# Patient Record
Sex: Female | Born: 1960 | Race: White | Hispanic: No | Smoking: Never smoker
Health system: Southern US, Community
[De-identification: ages and names within clinical notes are randomized; demographics above are authoritative.]

## PROBLEM LIST (undated history)

## (undated) DIAGNOSIS — R112 Nausea with vomiting, unspecified: Secondary | ICD-10-CM

## (undated) DIAGNOSIS — T7840XA Allergy, unspecified, initial encounter: Secondary | ICD-10-CM

## (undated) DIAGNOSIS — Z9889 Other specified postprocedural states: Secondary | ICD-10-CM

## (undated) DIAGNOSIS — B009 Herpesviral infection, unspecified: Secondary | ICD-10-CM

## (undated) HISTORY — DX: Herpesviral infection, unspecified: B00.9

## (undated) HISTORY — PX: OTHER SURGICAL HISTORY: SHX169

## (undated) HISTORY — DX: Allergy, unspecified, initial encounter: T78.40XA

---

## 1990-07-30 HISTORY — PX: BREAST SURGERY: SHX581

## 1994-07-30 HISTORY — PX: UTERINE FIBROID SURGERY: SHX826

## 2005-11-05 ENCOUNTER — Emergency Department (HOSPITAL_COMMUNITY): Admission: EM | Admit: 2005-11-05 | Discharge: 2005-11-05 | Payer: Self-pay | Admitting: Family Medicine

## 2006-09-14 ENCOUNTER — Emergency Department (HOSPITAL_COMMUNITY): Admission: EM | Admit: 2006-09-14 | Discharge: 2006-09-14 | Payer: Self-pay | Admitting: Family Medicine

## 2008-09-16 ENCOUNTER — Ambulatory Visit (HOSPITAL_COMMUNITY): Admission: RE | Admit: 2008-09-16 | Discharge: 2008-09-16 | Payer: Self-pay | Admitting: Obstetrics and Gynecology

## 2009-09-27 ENCOUNTER — Emergency Department (HOSPITAL_COMMUNITY): Admission: EM | Admit: 2009-09-27 | Discharge: 2009-09-27 | Payer: Self-pay | Admitting: Family Medicine

## 2009-10-15 ENCOUNTER — Emergency Department (HOSPITAL_COMMUNITY): Admission: EM | Admit: 2009-10-15 | Discharge: 2009-10-15 | Payer: Self-pay | Admitting: Emergency Medicine

## 2010-01-27 IMAGING — US US PELVIS COMPLETE MODIFY
2 series · 13 of 25 positions shown · non-contrast
Comparison: None

CLINICAL DATA: Pelvic mass.  LMP 08/16/2008

TRANSABDOMINAL AND TRANSVAGINAL ULTRASOUND OF PELVIS
TECHNIQUE: Both transabdominal and transvaginal ultrasound
examinations of the pelvis were performed including evaluation of
the uterus, ovaries, adnexal regions, and pelvic cul-de-sac.

[Series 1: us transvaginal non-ob · 12 of 63 slices shown (1 of 2)]
[im 1/63]
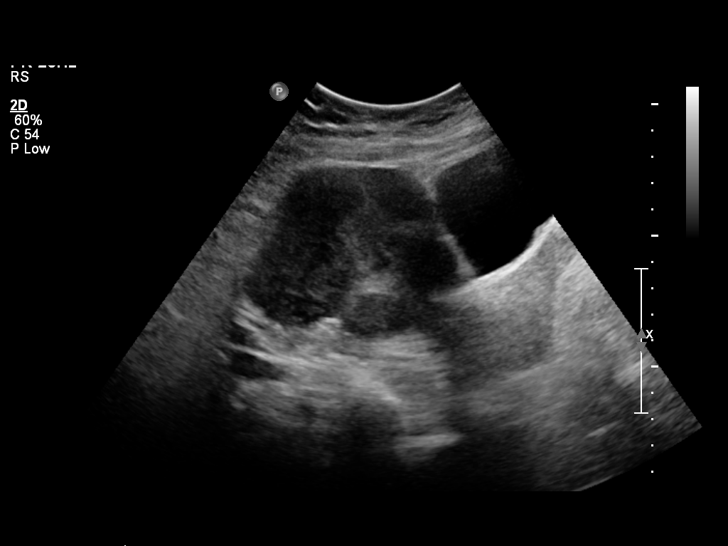
[im 6/63]
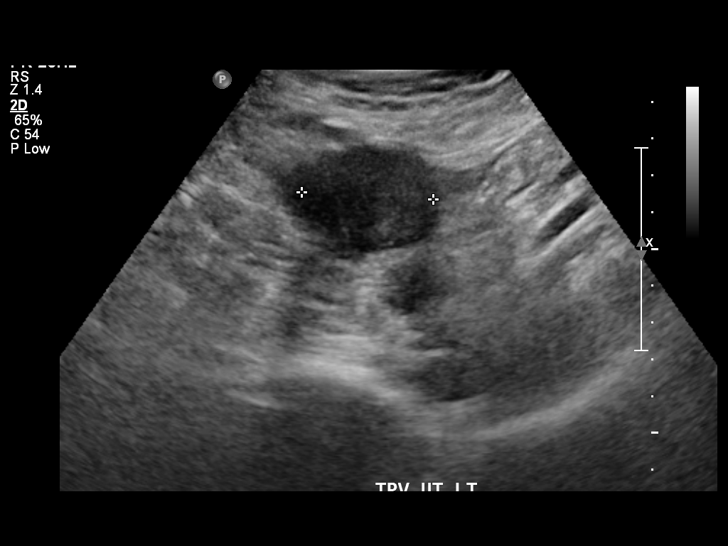
[im 11/63]
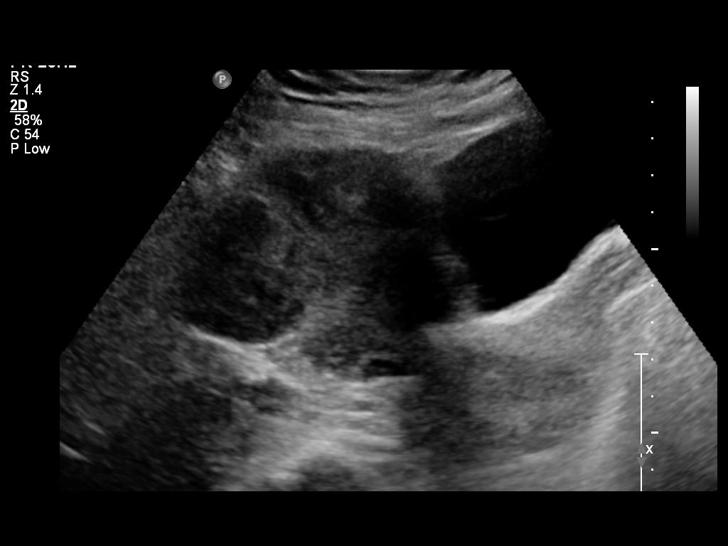
[im 17/63]
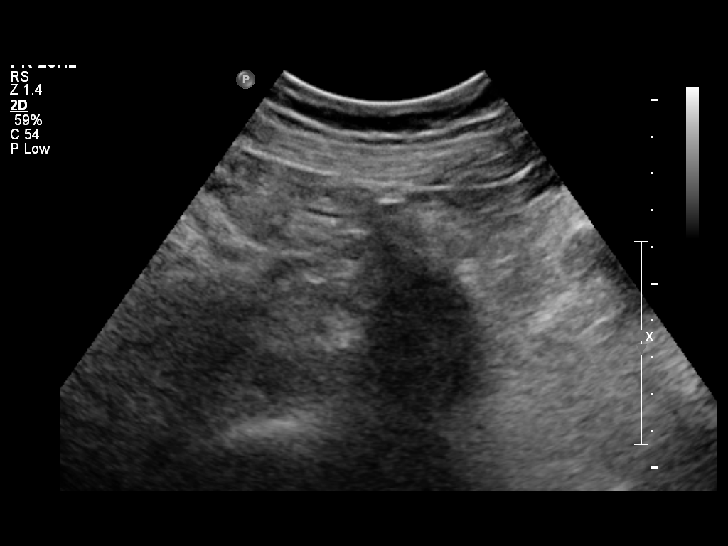
[im 22/63]
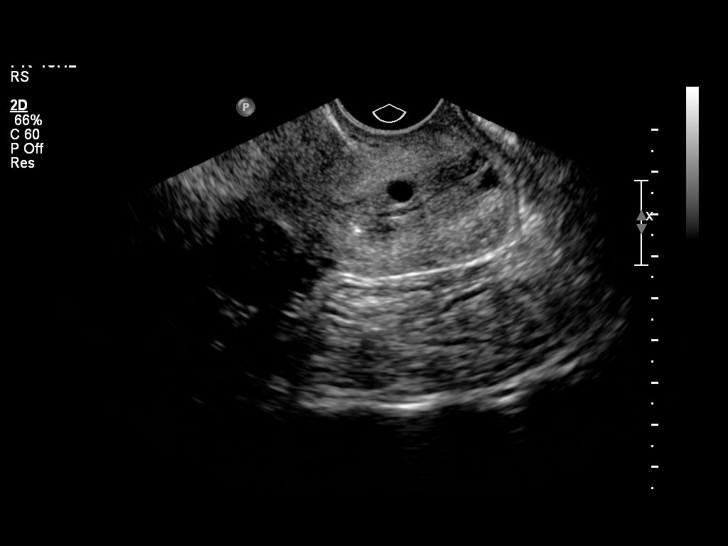
[im 27/63]
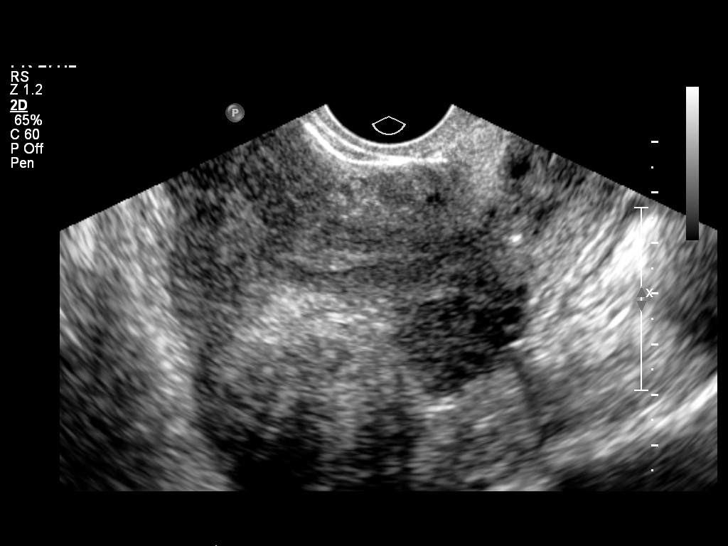
[im 33/63]
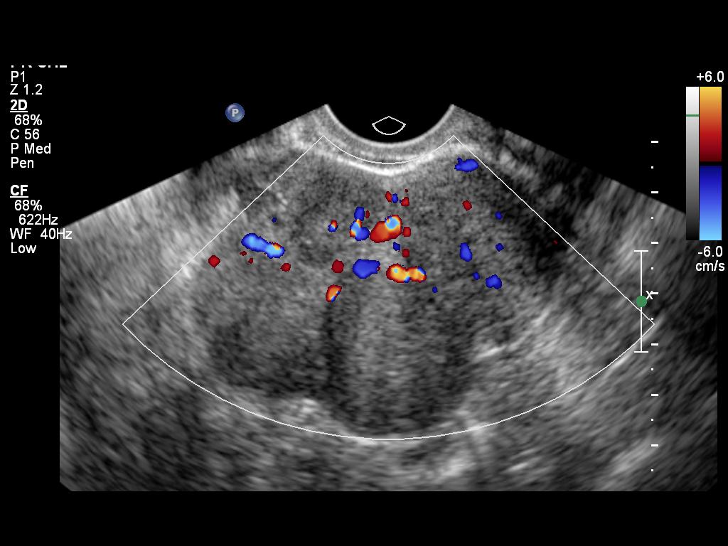
[im 38/63]
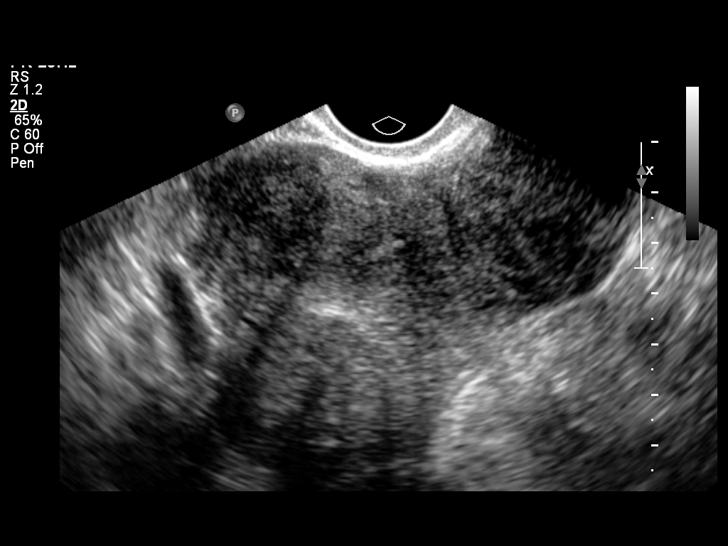
[im 44/63]
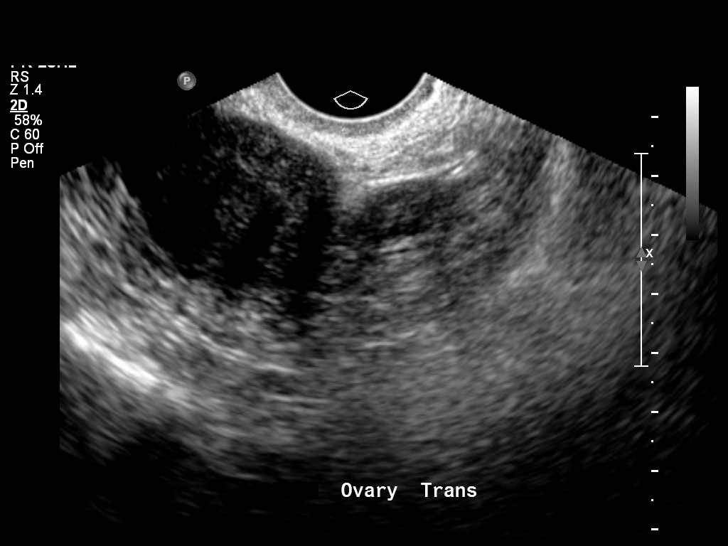
[im 49/63]
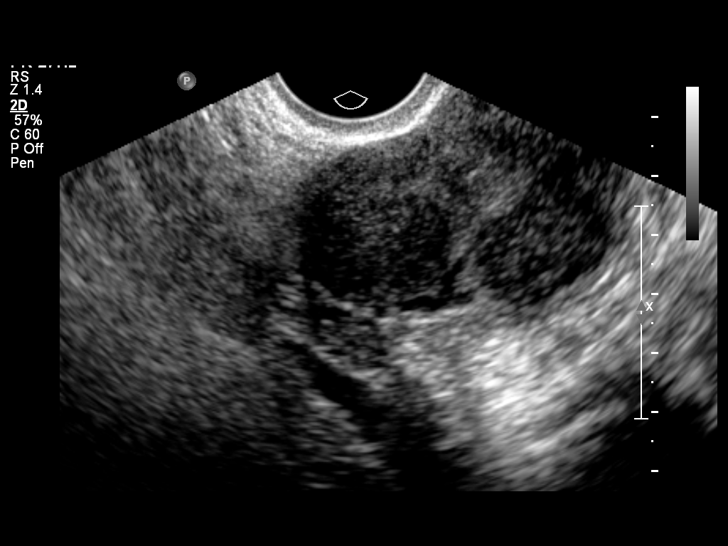
[im 54/63]
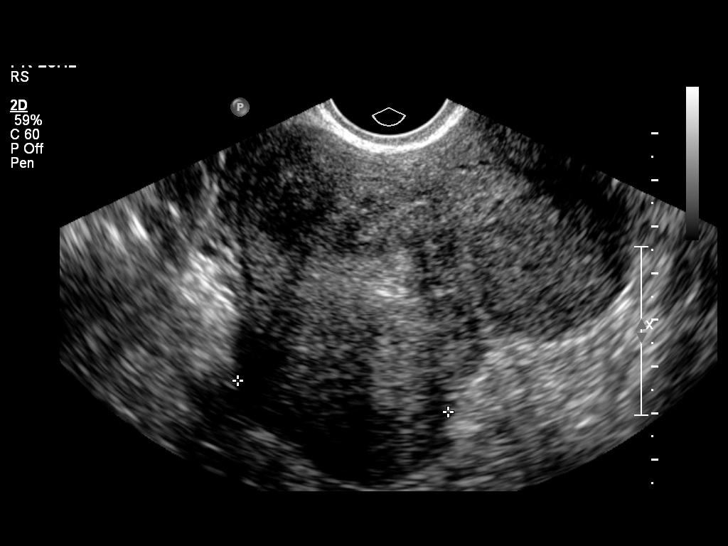
[im 60/63]
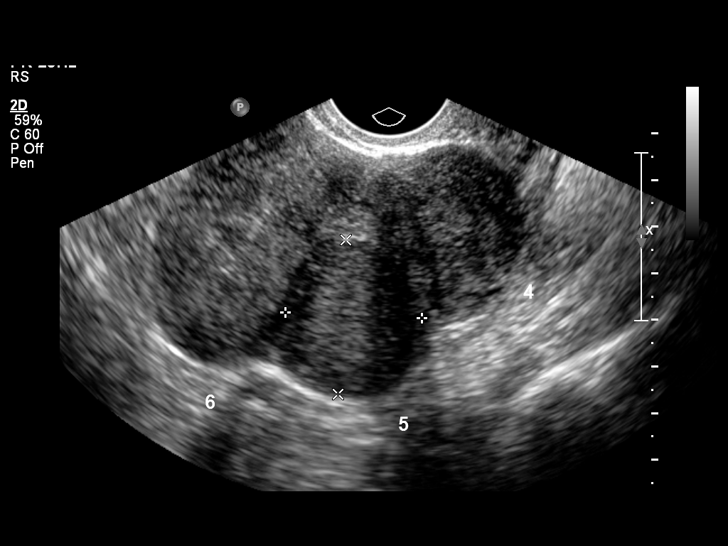

[Series 4: us transvaginal non-ob · 1 of 256 frames shown (2 of 2)]
[frame 129/256]
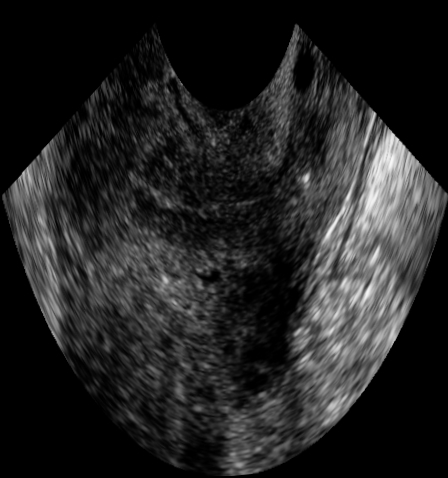

[13 of 25 positions shown; findings below may reference images not displayed]

FINDINGS: The uterus is enlarged demonstrating a sagittal length of
10.2 cm, an AP width of 5.4 cm and a transverse width of 6.6 cm.
There are multiple focal areas of altered echotexture compatible
with focal fibroids and these have sizes and locations as follows:
In the right posterolateral lower uterine segment, mural in
location measuring 2.8 x 2.7 x 2.9 cm, in the right posterolateral
fundal region measuring 4.3 x 3.8 x 4.6 cm and with a partial
subserosal component, in the posterior midbody with a partial
subserosal component measuring 2.2 x 2.1 x 2.2 cm, in the left
posterolateral lower uterine segment with a partial subserosal
component measuring 3.4 x 3.4 x 3.6 cm, in the left posterolateral
upper uterine segment measuring 2.9 x 3.3 x 3.4 cm and having a
partial subserosal component and in the posterior fundal region
measuring 3.0 x 3.3 x 3.2 cm and also having a partial subserosal
component.

The endometrial canal is thin and echogenic with an AP width of
mm.  No areas of focal thickening or inhomogeneity are identified
and no suggestion of a submucosal component to any of the
aforementioned fibroids is noted.

Both ovaries are seen and have a normal appearance with the right
ovary measuring 1.0 x 2.0 x 8.8 cm and the left ovary measuring
x 1.9 x 1.0 cm.

No cul-de-sac or periovarian fluid is seen.  No separate adnexal
masses are noted.
IMPRESSION: Fibroid uterus with fibroid sizes and locations as noted above.
Normal endometrial lining and ovaries.

## 2010-12-29 HISTORY — PX: COLON SURGERY: SHX602

## 2011-01-16 ENCOUNTER — Other Ambulatory Visit (INDEPENDENT_AMBULATORY_CARE_PROVIDER_SITE_OTHER): Payer: Self-pay | Admitting: Surgery

## 2011-01-16 ENCOUNTER — Encounter (HOSPITAL_COMMUNITY): Payer: PRIVATE HEALTH INSURANCE

## 2011-01-16 LAB — BASIC METABOLIC PANEL
BUN: 9 mg/dL (ref 6–23)
CO2: 25 mEq/L (ref 19–32)
Calcium: 9.3 mg/dL (ref 8.4–10.5)
Creatinine, Ser: 0.54 mg/dL (ref 0.50–1.10)
GFR calc non Af Amer: >60 mL/min (ref >60)
Potassium: 3.8 mEq/L (ref 3.5–5.1)

## 2011-01-16 LAB — CBC
MCH: 31 pg (ref 26.0–34.0)
RBC: 4.26 MIL/uL (ref 3.87–5.11)
WBC: 8.4 10*3/uL (ref 4.0–10.5)

## 2011-01-16 LAB — HCG, SERUM, QUALITATIVE: Preg, Serum: NEGATIVE

## 2011-01-16 LAB — SURGICAL PCR SCREEN
MRSA, PCR: NEGATIVE
Staphylococcus aureus: NEGATIVE

## 2011-01-18 ENCOUNTER — Encounter (INDEPENDENT_AMBULATORY_CARE_PROVIDER_SITE_OTHER): Payer: Self-pay | Admitting: Surgery

## 2011-01-18 DIAGNOSIS — N63 Unspecified lump in unspecified breast: Secondary | ICD-10-CM | POA: Insufficient documentation

## 2011-01-18 DIAGNOSIS — A64 Unspecified sexually transmitted disease: Secondary | ICD-10-CM

## 2011-01-18 DIAGNOSIS — R0989 Other specified symptoms and signs involving the circulatory and respiratory systems: Secondary | ICD-10-CM

## 2011-01-18 DIAGNOSIS — R490 Dysphonia: Secondary | ICD-10-CM

## 2011-01-18 DIAGNOSIS — J349 Unspecified disorder of nose and nasal sinuses: Secondary | ICD-10-CM

## 2011-01-18 DIAGNOSIS — K649 Unspecified hemorrhoids: Secondary | ICD-10-CM | POA: Insufficient documentation

## 2011-01-22 ENCOUNTER — Other Ambulatory Visit (INDEPENDENT_AMBULATORY_CARE_PROVIDER_SITE_OTHER): Payer: Self-pay | Admitting: Surgery

## 2011-01-22 ENCOUNTER — Inpatient Hospital Stay (HOSPITAL_COMMUNITY)
Admission: RE | Admit: 2011-01-22 | Discharge: 2011-01-25 | DRG: 331 | Disposition: A | Discharge status: Home / Self Care | Payer: PRIVATE HEALTH INSURANCE | Source: Ambulatory Visit | Attending: Surgery | Admitting: Surgery

## 2011-01-22 DIAGNOSIS — D371 Neoplasm of uncertain behavior of stomach: Secondary | ICD-10-CM

## 2011-01-22 DIAGNOSIS — K660 Peritoneal adhesions (postprocedural) (postinfection): Secondary | ICD-10-CM | POA: Diagnosis present

## 2011-01-22 DIAGNOSIS — D375 Neoplasm of uncertain behavior of rectum: Secondary | ICD-10-CM

## 2011-01-22 DIAGNOSIS — Z01812 Encounter for preprocedural laboratory examination: Secondary | ICD-10-CM

## 2011-01-22 DIAGNOSIS — D259 Leiomyoma of uterus, unspecified: Secondary | ICD-10-CM | POA: Diagnosis present

## 2011-01-22 DIAGNOSIS — D126 Benign neoplasm of colon, unspecified: Principal | ICD-10-CM | POA: Diagnosis present

## 2011-01-22 DIAGNOSIS — D378 Neoplasm of uncertain behavior of other specified digestive organs: Secondary | ICD-10-CM

## 2011-01-22 LAB — TYPE AND SCREEN
ABO/RH(D): B POS
Antibody Screen: NEGATIVE

## 2011-01-22 LAB — ABO/RH: ABO/RH(D): B POS

## 2011-01-23 LAB — CBC
HCT: 36.5 % (ref 36.0–46.0)
MCHC: 34 g/dL (ref 30.0–36.0)
MCV: 93.1 fL (ref 78.0–100.0)

## 2011-01-23 LAB — CREATININE, SERUM: GFR calc non Af Amer: >60 mL/min (ref >60)

## 2011-01-25 ENCOUNTER — Telehealth (INDEPENDENT_AMBULATORY_CARE_PROVIDER_SITE_OTHER): Payer: Self-pay | Admitting: Surgery

## 2011-01-25 NOTE — Telephone Encounter (Signed)
Message copied by Ethlyn Gallery on Thu Jan 25, 2011  4:57 PM ------      Message from: Ardeth Sportsman      Created: Thu Jan 25, 2011  2:55 PM       Tell pt the good news!

## 2011-01-25 NOTE — Telephone Encounter (Signed)
Called pt to notify her of the pathology report. Pt notified pathology to be a pre-cancerous polyp and lymph nodes benign per Dr Michaell Cowing. Pt will call back to schedule f/u appt with Dr Michaell Cowing for 2-3 weeks. [facility] 01-25-11

## 2011-01-25 NOTE — Op Note (Signed)
Autumn Calderon, MEDDERS NO.:  [id number]  MEDICAL RECORD NO.:  [id number]  LOCATION:  1534                         FACILITY:  [facility]  PHYSICIAN:  Ardeth Sportsman, MD     DATE OF BIRTH:  March 09, 1961  DATE OF PROCEDURE:  01/22/2011 DATE OF DISCHARGE:                              OPERATIVE REPORT   PRIMARY CARE PHYSICIAN:  Dr. Kyra Manges  GASTROENTEROLOGIST:  Jordan Hawks. Elnoria Howard, M.D.  SURGEON:  Ardeth Sportsman, M.D.  ASSISTANT:  Velora Heckler, MD  PREOPERATIVE DIAGNOSIS:  Colon polyps of hepatic flexure, unresectable endoscopically.  POSTOPERATIVE DIAGNOSES: 1. Colon polyps of hepatic flexure, unresectable endoscopically. 2. Fibroid uterus.  PROCEDURES PERFORMED: 1. Laparoscopic lysis of adhesions for 60 minutes. 2. Laparoscopically assisted partial colectomy with ileocolonic     anastomosis.  ANESTHESIA: 1. General anesthesia. 2. Local anesthetic in a field block around all port sites. 3. On-Q continuous bupivacaine pain pump.  SPECIMENS:  Right colon.  DRAINS:  None.  ESTIMATED BLOOD LOSS:  50 mL.  COMPLICATIONS:  None.  MAJOR INDICATIONS:  Ms. Kneece is a 50 year old mostly healthy female who had screening colonoscopy and was found to have a segment in the distal ascending colon near the hepatic flexure carpeted with numerous polyps. Dr. Elnoria Howard did not feel this was safe to be removed endoscopically and therefore recommended segmental partial colectomy.  The anatomy and physiology of the digestive tract was explained.  Pathophysiology of colonic polyposis was discussed.  Biopsies so far showed no high-grade dysplasia, but given the large region,  it was felt it would be wisest to do a segmental resection.  Technique, risks, benefits, alternatives were discussed.  Questions answered.  She agreed to proceed.  OPERATIVE FINDINGS:  She had about 8 cm segment carpeting polyps in the mid-to-distal ascending colon to the hepatic flexure.  She had  no evidence of any metastatic disease.  She had moderate adhesions of omentum to anterior abdominal wall and a fibroid uterus that was about 4 times normal as well as to colon.  DESCRIPTION OF PROCEDURE:  Informed consent was confirmed.  The patient underwent general anesthesia without any difficulty.  She was placed on the anti-ileus protocol.  She received subcutaneous heparin preoperatively and had sequential compression devices  active during the entire case.  She was positioned in low lithotomy with arms tucked.  Her abdomen was prepped and draped in a sterile fashion.  Surgical time-out confirmed the plan.  I attempted to place a 5 mm port through a cut-down incision through her prior infraumbilical incision and used optical entry.  I could get down to some of the  deeper preperitoneal plains, but did not feel I was able to get a clean entry.  Therefore, I converted from a single site to multiple-port colectomy.  I placed a #5 mm port in the left upper quadrant using optical entry technique with the patient in steep reverse Trendelenburg and left side up.  Entry was clean.  Camera inspection noted moderate adhesions of omentum to the infraumbilical region.  I was able to take the 5 mm port through the infraumbilical space and pass it into the peritoneal cavity. It was  not going into omentum and bowel and there was no evidence of any colonic or small bowel injury.  I placed another 5 mm port in the left midabdomen.  I did care to free the omentum off the anterior abdominal wall using a focussed cold scissors as well as some Enseal bipolar cautery system. With that, I could find the greater omentum had dense adhesions to a fibroid uterus as well as to the left colon.  Gradually, I was able to elevate it and free it off.  I did some gentle blunt dissection as well as focussed bipolar dissection and freed the great omentum off the fibroid uterus and then also off the left colon to  the splenic flexure. At that point, I could transition mainly over to cold scissors and only focussed bipolar as the attachments to the colon were more thin and wispy and not as bulky.  With that, I could mobilize the greater omentum out of the way.  There were only a few scant adhesions to the small bowel and I freed those off using scissors.  I could isolate the cecum and the appendix. __________ the cecum and noted in the distal ascending colon going up to the hepatic flexure where the tattooing on the posterior wall that Dr. Elnoria Howard had noted.  This could be seen with accurate localization.  I did not see any evidence of any peritoneal or visceral nodularity or metastasis and no evidence of any liver abnormalities.  Again, she had a fibroid uterus of moderate size, say about 4 times normal, but the ovaries looked normal and the fallopian tubes looked normal.  Her colon sweep otherwise looked normal.  Again, I mobilized the right colon in a medial to lateral fashion.  I elevated the cecum and found the ileocecal pedicle.  I was able to go between the ileomesentry and this pedicle and get in to the retromesenteric plane on the right side.  I freed the right colon mesentry off the duodenal sweep and pancreatic head coming up towards the falciform ligament.  I came more laterally and freed it off Gerota fascia as well.  I then mobilized in a lateral to medial fashion.  I freed the terminal ileomesentry off the pelvic brim and the psoas, taking care to isolate and skeletonize that and stay away from the retroperitoneal structures such as the ureter, gonadal vessels, etc.  I was able to come up and meet into the retromesenteric pocket of dissection and mobilize up around the hepatic flexure.  I freed that off.  Next, I found midtransverse colon and mobilized the mid-to-proximal transverse colon in a superior-to-inferior fashion.  I was able to free the attachments to the retro-mesentery  just superior to the duodenal bulb, going through thin, wispy layers and getting into that pocket. That way, I could completely mobilize the whole right colon towards the left abdomen off the midline.  This was consistent with excellent mobilization.  I made an extraction incision through the umbilicus.  I initially tried to preserve her prior belly button piercing superiorly, but had to go through that to be able to get enough wound , as she had rather bulky omentum and mesentry.  With that, I could eviscerate her whole right colon.  I could feel the polyps in between the 2 tattooed areas in the mid-to-distal ascending colon, of hepatic flexure, with excellent mobilization.  I chose a spot in the midtransverse colon with a good feeder off the right middle colic artery that was  over 5 cm distal to the tattooing of the hepatic flexure.  I transected that with a staple. I found a similar location in the terminal ileum as well.  I took the mesentery in a radial fashion.  I had not done previous high ligation as I wanted to make sure I had proper localization before ligating a major colonic branch.  I was able to come down in a radial-like fashion to the ileocolic pedicle and close to its takeoff off the middle colic artery. We skeletonized that and took most of the mesentery radially using bipolar Enseal, but once we came to the ileocolic pedicle, I ligated that using 2-0 silk ties, clamps x2, and Enseal distally.  With that, I took the specimen off, opened it up at the cecum away from the polyps.  I could easily feel the polyps carpeting about two-thirds of the circumference and I had at least 5 cm distal margin.  I had a pretty good high ligation as well and a decent swath of mesentery.  I changed gloves.  We ensured hemostasis.  I did a side-to-side stapled anastomosis using a 75-GIA stapler and closing the staple defect using interrupted silk stitches.  I trimmed some of the omentum  that was rather stretched out in a tongue and shortened that down to a more scalloped location to avoid any potential for further leak points and since it also looked rather thinned out there.  I laid a good healthy swath of omentum over the anastomosis, especially __________ used it as an omental patch using 2-0 and 3-0 interrupted silk stitches x3 to a good nice healthy patch.  I closed the common mesenteric defect using interrupted silk stitches down to the ties at the base.  We made sure we had proper orientation.  I returned the colon into the abdomen.  I did camera inspection. Hemostasis was excellent.  I irrigated the pelvis and a retrohepatic space around the small bowel and there was no torsion or twisting of the mesentry and defect was closed, omentum laid well, was viable.  I evacuated capnoperitoneum and removed the ports.  I placed the On-Q sheath in the preperitoneal plane by palpation through a prior Pfannenstiel incision going up towards both costal ridges and anterior axillary line.  I closed the periumbilical fascial defect using #1 PDS running stitch.  I closed the skin using interrupted Monocryl stitch in the ports.  A sterile dressing was applied.  The patient was extubated and taken to the recovery room in stable condition.  I had discussed postoperative care with the patient in detail in the office and prior to surgery, I am about to discuss it again with the family.     Ardeth Sportsman, MD     SCG/MEDQ  D:  01/22/2011  T:  01/23/2011  Job:  [id number]  cc:   Jordan Hawks. Elnoria Howard, MD Fax: [phone]  Dr. Kyra Manges  Electronically Signed by Karie Soda MD on 01/25/2011 07:15:07 AM

## 2011-02-05 NOTE — Discharge Summary (Signed)
NAMEHISAKO, Autumn Calderon NO.:  [id number]  MEDICAL RECORD NO.:  [id number]  LOCATION:  1534                         FACILITY:  [facility]  PHYSICIAN:  Ardeth Sportsman, MD     DATE OF BIRTH:  1961/05/31  DATE OF ADMISSION:  01/22/2011 DATE OF DISCHARGE:  01/25/2011                              DISCHARGE SUMMARY   PRIMARY CARE PHYSICIAN:  Katherine Roan, MD  GASTROENTEROLOGIST:  Jordan Hawks. Elnoria Howard, MD  SURGEON:  Ardeth Sportsman, MD  PRINCIPAL DIAGNOSIS:  Carpeted section of colonic polyps at ascending/hepatic flexure of colon.  OTHER DIAGNOSES: 1. Chronic seasonal allergies. 2. Uterine fibroid status post myomectomy. 3. Left breast fibroadenoma, status post excision.  PROCEDURE PERFORMED:  Laparoscopically-assisted right partial colectomy with ileocolonic anastomosis on January 22, 2011.  Pathology is pending.  MEDICATIONS:  As noted in the system, which includes ibuprofen, acyclovir, loratadine, and also she will have psyllium and oxycodone.  SUMMARY OF HOSPITAL COURSE:  Autumn Calderon is a 50 year old female who is found to have a carpeted section of polyps in the hepatic flexure near her ascending colon.  These were not felt to be safe to be endoscopically removed.  Dr. Elnoria Howard sent the patient to me for surgical evaluation.  She underwent partial colectomy.  Postoperatively, she was placed on the anti-ileus protocol.  She was advanced on her diet.  She was mobilized.  She had On-Q pain pump placed.  By the time of discharge, she is tolerating a solid diet, she has a good flatus, her pain is controlled with pain pills.  She is walking around the hallways.  She did have a little bit of a sore throat on postoperative day #2 with some sinusitis, but it is improved after getting her next dose of Claritin.  Based on these improvements, I feel it is safe to discharge home with the following instructions: 1. She is to return to clinic to see me in 10 to 14 days. 2. She  should continue to walk an hour a day, take fiber regularly,     use milk of magnesia p.r.n. for constipation.  She can use over-the-     counter antidiarrheals if she has issues with that as well.  She     should stick to mainly a bland diet at least first few days and     then gradually advance to a regular diet over the next week as     tolerated.  She should shower every day, keep the incisions clean     and dry.  She should call if she has worsening drainage or pain     from the incisions.  She should call if she has worsening fevers, chills, sweats, nausea, vomiting, bloating, uncontrolled diarrhea, worsening fatigue or fevers or other concerns.  I will notify her of the pathology results as soon as they come back, which may be later this week versus next week.     Ardeth Sportsman, MD     SCG/MEDQ  D:  01/25/2011  T:  01/25/2011  Job:  [id number]  cc:   S. Kyra Manges, M.D. Fax: [phone]  Jordan Hawks. Elnoria Howard, MD Fax: [phone]  Electronically Signed by Karie Soda MD on 02/05/2011 09:13:42 AM

## 2011-02-12 ENCOUNTER — Encounter (INDEPENDENT_AMBULATORY_CARE_PROVIDER_SITE_OTHER): Payer: Self-pay | Admitting: Surgery

## 2011-02-12 ENCOUNTER — Ambulatory Visit (INDEPENDENT_AMBULATORY_CARE_PROVIDER_SITE_OTHER): Payer: PRIVATE HEALTH INSURANCE | Admitting: Surgery

## 2011-02-12 DIAGNOSIS — D126 Benign neoplasm of colon, unspecified: Secondary | ICD-10-CM | POA: Insufficient documentation

## 2011-02-12 NOTE — Progress Notes (Signed)
Subjective:     Patient ID: Autumn Calderon, female   DOB: 1960-09-25, 50 y.o.   MRN: [id number]  HPI  The patient comes in today feeling well. She has 1-2 BMs a day. She's eating most everything she wants but has held back on salads, wonders if it is safe to start.  No fevers, chills, sweats. Off all pain meds. Feeling soreness with increased activity but overall improving. She wonders if it is okay to go back to work later this week.  Review of Systems  Constitutional: Negative for fever, chills, diaphoresis, appetite change and fatigue.  HENT: Negative for nosebleeds, sore throat, mouth sores, neck pain and neck stiffness.   Eyes: Negative for photophobia, discharge and visual disturbance.  Respiratory: Negative for cough, choking, chest tightness and shortness of breath.   Cardiovascular: Negative for chest pain and palpitations.  Gastrointestinal: Positive for abdominal pain. Negative for nausea, vomiting, diarrhea, constipation, blood in stool, abdominal distention, anal bleeding and rectal pain.       Mild soreness- improving  Genitourinary: Negative for dysuria, frequency, flank pain, vaginal bleeding, vaginal discharge and difficulty urinating.  Musculoskeletal: Negative for back pain, arthralgias and gait problem.  Skin: Negative for color change, pallor and rash.  Neurological: Negative for dizziness, speech difficulty, weakness and numbness.  Hematological: Negative for adenopathy. Does not bruise/bleed easily.  Psychiatric/Behavioral: Negative for confusion and agitation. The patient is not nervous/anxious.        Objective:   Physical Exam  Constitutional: She is oriented to person, place, and time. She appears well-developed and well-nourished. No distress.  HENT:  Head: Normocephalic.  Mouth/Throat: Oropharynx is clear and moist. No oropharyngeal exudate.  Eyes: Conjunctivae and EOM are normal. Pupils are equal, round, and reactive to light. No scleral icterus.  Neck:  Normal range of motion. Neck supple. No tracheal deviation present.  Cardiovascular: Normal rate, regular rhythm and intact distal pulses.   Pulmonary/Chest: Effort normal and breath sounds normal. No respiratory distress. She exhibits no tenderness.  Abdominal: Soft. She exhibits no distension and no mass. There is tenderness. There is no rebound and no guarding. Hernia confirmed negative in the right inguinal area and confirmed negative in the left inguinal area.       Mild soreness left perimedian at umbilical level.  Incisions well healed  Genitourinary: No vaginal discharge found.  Musculoskeletal: Normal range of motion. She exhibits no tenderness.  Lymphadenopathy:    She has no cervical adenopathy.       Right: No inguinal adenopathy present.       Left: No inguinal adenopathy present.  Neurological: She is alert and oriented to person, place, and time. No cranial nerve deficit. She exhibits normal muscle tone. Coordination normal.  Skin: Skin is warm and dry. No rash noted. She is not diaphoretic. No erythema.  Psychiatric: She has a normal mood and affect. Her behavior is normal. Judgment and thought content normal.   Pathology: Tubovillous adenoma with high-grade dysplasia. 0/18 lymph nodes positive.    Assessment:     3 weeks status post laparoscopic colectomy, recovering well. Pathology precancerous/benign.    Plan:     Increase activity as tolerated.  Diet as tolerated. Okay to phase in raw vegetables/salads. Go gradually.  Okay to return to work later this week. Half days Thursday/Friday. Then full time next week. I wrote a note.  Followup colonoscopy in one year with Dr. Elnoria Howard  Return to clinic p.r.n. She's recovered remarkably well. I left the door open  to call if new/further issues. She expressed understanding and appreciation

## 2011-04-17 ENCOUNTER — Telehealth (INDEPENDENT_AMBULATORY_CARE_PROVIDER_SITE_OTHER): Payer: Self-pay

## 2011-04-17 NOTE — Telephone Encounter (Signed)
Returned [employer]. Pt was calling b/c noticed having some lt side abdominal pain that comes and goes. Pt has been taking Metamucil but not really noticing any advantages to taking the fiber supliment so I recommended her switching to Mirilax. I told the pt to try this for a couple of weeks and see if she can tell a difference but if not to call me back and I would get her in to see Dr Michaell Cowing for an exam. The pt was fine with this and would call back if needed.Autumn Calderon

## 2013-04-28 ENCOUNTER — Encounter (INDEPENDENT_AMBULATORY_CARE_PROVIDER_SITE_OTHER): Payer: Self-pay

## 2013-08-04 ENCOUNTER — Other Ambulatory Visit: Payer: Self-pay | Admitting: Obstetrics & Gynecology

## 2013-08-06 ENCOUNTER — Encounter (HOSPITAL_COMMUNITY): Payer: Self-pay

## 2013-08-07 ENCOUNTER — Encounter (HOSPITAL_COMMUNITY): Payer: Self-pay

## 2013-08-21 ENCOUNTER — Ambulatory Visit (HOSPITAL_COMMUNITY)
Admission: RE | Admit: 2013-08-21 | Discharge: 2013-08-21 | Disposition: A | Discharge status: Home / Self Care | Payer: PRIVATE HEALTH INSURANCE | Source: Ambulatory Visit | Attending: Obstetrics & Gynecology | Admitting: Obstetrics & Gynecology

## 2013-08-21 ENCOUNTER — Encounter (HOSPITAL_COMMUNITY)
Admission: RE | Disposition: A | Discharge status: Home / Self Care | Payer: Self-pay | Source: Ambulatory Visit | Attending: Obstetrics & Gynecology

## 2013-08-21 ENCOUNTER — Encounter (HOSPITAL_COMMUNITY): Payer: PRIVATE HEALTH INSURANCE | Admitting: Anesthesiology

## 2013-08-21 ENCOUNTER — Ambulatory Visit (HOSPITAL_COMMUNITY): Payer: PRIVATE HEALTH INSURANCE | Admitting: Anesthesiology

## 2013-08-21 ENCOUNTER — Encounter (HOSPITAL_COMMUNITY): Payer: Self-pay | Admitting: *Deleted

## 2013-08-21 DIAGNOSIS — D250 Submucous leiomyoma of uterus: Secondary | ICD-10-CM | POA: Insufficient documentation

## 2013-08-21 DIAGNOSIS — N915 Oligomenorrhea, unspecified: Secondary | ICD-10-CM | POA: Insufficient documentation

## 2013-08-21 DIAGNOSIS — N840 Polyp of corpus uteri: Secondary | ICD-10-CM | POA: Insufficient documentation

## 2013-08-21 HISTORY — PX: DILATATION & CURRETTAGE/HYSTEROSCOPY WITH RESECTOCOPE: SHX5572

## 2013-08-21 HISTORY — DX: Other specified postprocedural states: Z98.890

## 2013-08-21 HISTORY — DX: Nausea with vomiting, unspecified: R11.2

## 2013-08-21 LAB — CBC
HCT: 39.2 % (ref 36.0–46.0)
Hemoglobin: 13.3 g/dL (ref 12.0–15.0)
MCH: 30.3 pg (ref 26.0–34.0)
MCHC: 33.9 g/dL (ref 30.0–36.0)
MCV: 89.3 fL (ref 78.0–100.0)
PLATELETS: 261 10*3/uL (ref 150–400)
RBC: 4.39 MIL/uL (ref 3.87–5.11)
RDW: 13.3 % (ref 11.5–15.5)
WBC: 7 10*3/uL (ref 4.0–10.5)

## 2013-08-21 SURGERY — DILATATION & CURETTAGE/HYSTEROSCOPY WITH RESECTOCOPE
Anesthesia: General | Site: Vagina

## 2013-08-21 MED ORDER — ONDANSETRON HCL 4 MG/2ML IJ SOLN
INTRAMUSCULAR | Status: DC | PRN
  Administered 2013-08-21: 4 mg via INTRAVENOUS

## 2013-08-21 MED ORDER — CHLOROPROCAINE HCL 1 % IJ SOLN
INTRAMUSCULAR | Status: AC
  Filled 2013-08-21: qty 30

## 2013-08-21 MED ORDER — PROPOFOL 10 MG/ML IV BOLUS
INTRAVENOUS | Status: DC | PRN
  Administered 2013-08-21: 180 mg via INTRAVENOUS

## 2013-08-21 MED ORDER — MIDAZOLAM HCL 2 MG/2ML IJ SOLN
INTRAMUSCULAR | Status: DC | PRN
  Administered 2013-08-21: 2 mg via INTRAVENOUS

## 2013-08-21 MED ORDER — METRONIDAZOLE IN NACL 5-0.79 MG/ML-% IV SOLN
500.0000 mg | Freq: Once | INTRAVENOUS | Status: AC
  Administered 2013-08-21: 500 mg via INTRAVENOUS
  Filled 2013-08-21: qty 100

## 2013-08-21 MED ORDER — LIDOCAINE HCL (CARDIAC) 20 MG/ML IV SOLN
INTRAVENOUS | Status: AC
  Filled 2013-08-21: qty 5

## 2013-08-21 MED ORDER — FENTANYL CITRATE 0.05 MG/ML IJ SOLN
INTRAMUSCULAR | Status: DC | PRN
  Administered 2013-08-21: 100 ug via INTRAVENOUS

## 2013-08-21 MED ORDER — PROPOFOL 10 MG/ML IV EMUL
INTRAVENOUS | Status: AC
  Filled 2013-08-21: qty 20

## 2013-08-21 MED ORDER — FENTANYL CITRATE 0.05 MG/ML IJ SOLN
INTRAMUSCULAR | Status: AC
  Filled 2013-08-21: qty 5

## 2013-08-21 MED ORDER — ACETAMINOPHEN 500 MG PO TABS: 1000.0000 mg | ORAL_TABLET | ORAL | Status: DC | PRN

## 2013-08-21 MED ORDER — ACETAMINOPHEN 160 MG/5ML PO SOLN: 325.0000 mg | ORAL | Status: DC | PRN

## 2013-08-21 MED ORDER — KETOROLAC TROMETHAMINE 30 MG/ML IJ SOLN
INTRAMUSCULAR | Status: DC | PRN
  Administered 2013-08-21: 30 mg via INTRAVENOUS

## 2013-08-21 MED ORDER — DEXAMETHASONE SODIUM PHOSPHATE 10 MG/ML IJ SOLN
INTRAMUSCULAR | Status: AC
  Filled 2013-08-21: qty 1

## 2013-08-21 MED ORDER — LACTATED RINGERS IV SOLN
INTRAVENOUS | Status: DC
  Administered 2013-08-21 (×3): via INTRAVENOUS

## 2013-08-21 MED ORDER — ONDANSETRON HCL 4 MG/2ML IJ SOLN
INTRAMUSCULAR | Status: AC
  Filled 2013-08-21: qty 2

## 2013-08-21 MED ORDER — DEXAMETHASONE SODIUM PHOSPHATE 10 MG/ML IJ SOLN
INTRAMUSCULAR | Status: DC | PRN
  Administered 2013-08-21: 10 mg via INTRAVENOUS

## 2013-08-21 MED ORDER — KETOROLAC TROMETHAMINE 30 MG/ML IJ SOLN
INTRAMUSCULAR | Status: AC
  Filled 2013-08-21: qty 1

## 2013-08-21 MED ORDER — FENTANYL CITRATE 0.05 MG/ML IJ SOLN: 25.0000 ug | INTRAMUSCULAR | Status: DC | PRN

## 2013-08-21 MED ORDER — CHLOROPROCAINE HCL 1 % IJ SOLN
INTRAMUSCULAR | Status: DC | PRN
  Administered 2013-08-21: 20 mL

## 2013-08-21 MED ORDER — MIDAZOLAM HCL 2 MG/2ML IJ SOLN
INTRAMUSCULAR | Status: AC
  Filled 2013-08-21: qty 2

## 2013-08-21 MED ORDER — LIDOCAINE HCL (CARDIAC) 20 MG/ML IV SOLN
INTRAVENOUS | Status: DC | PRN
  Administered 2013-08-21: 50 mg via INTRAVENOUS

## 2013-08-21 MED ORDER — GLYCINE 1.5 % IR SOLN
Status: DC | PRN
  Administered 2013-08-21: 3000 mL

## 2013-08-21 SURGICAL SUPPLY — 18 items
ABLATOR ENDOMETRIAL BIPOLAR (ABLATOR) IMPLANT
CANISTER SUCT 3000ML (MISCELLANEOUS) ×3 IMPLANT
CATH ROBINSON RED A/P 16FR (CATHETERS) ×3 IMPLANT
CLOTH BEACON ORANGE TIMEOUT ST (SAFETY) ×3 IMPLANT
CONTAINER PREFILL 10% NBF 60ML (FORM) ×6 IMPLANT
DRSG TELFA 3X8 NADH (GAUZE/BANDAGES/DRESSINGS) ×3 IMPLANT
ELECT REM PT RETURN 9FT ADLT (ELECTROSURGICAL) ×3
ELECTRODE REM PT RTRN 9FT ADLT (ELECTROSURGICAL) ×1 IMPLANT
GLOVE BIO SURGEON STRL SZ 6.5 (GLOVE) ×2 IMPLANT
GLOVE BIO SURGEONS STRL SZ 6.5 (GLOVE) ×1
GLOVE BIOGEL PI IND STRL 7.0 (GLOVE) ×1 IMPLANT
GLOVE BIOGEL PI INDICATOR 7.0 (GLOVE) ×2
GOWN STRL REIN XL XLG (GOWN DISPOSABLE) ×6 IMPLANT
LOOP ANGLED CUTTING 22FR (CUTTING LOOP) ×3 IMPLANT
PACK HYSTEROSCOPY LF (CUSTOM PROCEDURE TRAY) ×3 IMPLANT
PAD OB MATERNITY 4.3X12.25 (PERSONAL CARE ITEMS) ×3 IMPLANT
TOWEL OR 17X24 6PK STRL BLUE (TOWEL DISPOSABLE) ×6 IMPLANT
WATER STERILE IRR 1000ML POUR (IV SOLUTION) ×3 IMPLANT

## 2013-08-21 NOTE — Discharge Instructions (Addendum)
Hysteroscopy, Care After Refer to this sheet in the next few weeks. These instructions provide you with information on caring for yourself after your procedure. Your health care provider may also give you more specific instructions. Your treatment has been planned according to current medical practices, but problems sometimes occur. Call your health care provider if you have any problems or questions after your procedure.  WHAT TO EXPECT AFTER THE PROCEDURE After your procedure, it is typical to have the following:  You may have some cramping. This normally lasts for a couple days.  You may have bleeding. This can vary from light spotting for a few days to menstrual-like bleeding for 3 7 days. HOME CARE INSTRUCTIONS  Rest for the first 1 2 days after the procedure.  Only take over-the-counter or prescription medicines as directed by your health care provider. Do not take aspirin. It can increase the chances of bleeding.  Take showers instead of baths for 2 weeks or as directed by your health care provider.  Do not drive for 24 hours or as directed.  Do not drink alcohol while taking pain medicine.  Do not use tampons, douche, or have sexual intercourse for 2 weeks or until your health care provider says it is okay.  Take your temperature twice a day for 4 5 days. Write it down each time.  Follow your health care provider's advice about diet, exercise, and lifting.  If you develop constipation, you may:  Take a mild laxative if your health care provider approves.  Add bran foods to your diet.  Drink enough fluids to keep your urine clear or pale yellow.  Try to have someone with you or available to you for the first 24 48 hours, especially if you were given a general anesthetic.  Follow up with your health care provider as directed.  No Ibuprofen containing products (ie Advil. Aleve, Motrin, etc.) until after 4:00 pm today.   SEEK MEDICAL CARE IF:  You feel dizzy or  lightheaded.  You feel sick to your stomach (nauseous).  You have abnormal vaginal discharge.  You have a rash.  You have pain that is not controlled with medicine. SEEK IMMEDIATE MEDICAL CARE IF:  You have bleeding that is heavier than a normal menstrual period.  You have a fever.  You have increasing cramps or pain, not controlled with medicine.  You have new belly (abdominal) pain.  You pass out.  You have pain in the tops of your shoulders (shoulder strap areas).  You have shortness of breath. Document Released: 05/06/2013 Document Reviewed: 02/12/2013 [facility] Patient Information 2014 [place], [place].

## 2013-08-21 NOTE — Op Note (Signed)
08/21/2013  9:59 AM  PATIENT:  Autumn Calderon  53 y.o. female  PRE-OPERATIVE DIAGNOSIS:  Intra-Uterine Lesion, Oligomenorrhea    POST-OPERATIVE DIAGNOSIS:  Intra-Uterine Lesion, Oligomenorrhea    PROCEDURE:  Procedure(s): DILATATION & CURETTAGE/HYSTEROSCOPY WITH RESECTOCOPE  SURGEON:  Surgeon(s): Princess Bruins, MD  ASSISTANTS: none   ANESTHESIA:   general  PROCEDURE:  With laryngeal mask the patient is in lithotomy position. She is prepped with Betadine on the suprapubic, vulvar and vaginal areas. The bladder is catheterized. The patient is draped as usual. The vaginal exam reveals an anteverted uterus, no adnexal mass. The speculum is inserted in the vagina and the anterior lip of the cervix is grasped with a tenaculum. A paracervical block is done with Nesacaine 1% a total of 20 cc at 4 and 8:00. Dilation of the cervix with Hegar dilators up to #33 without difficulty. The hysteroscope was introduced and the intrauterine cavity and both ostia are well visualized, as well as an intrauterine lesion of about 3 cm in length attached to the fundus.  It probably corresponds to a polyp or possibly a submucosal myoma.  The resectoscope with the loop was used to remove the intra uterine lesion completely.  We then proceed with an intrauterine curettage on all intrauterine surfaces with a sharp curet. Both specimens are sent together to pathology.  Pictures were taken of the intrauterine cavity both before and after resection.  Hemostasis was adequate.  Cavity was normal with no other lesion.  All instruments were removed. The patient was brought to recovery room in good and stable status.  ESTIMATED BLOOD LOSS: 20 cc  FLUID DEFICIT:  130 cc   Intake/Output Summary (Last 24 hours) at 08/21/13 0959 Last data filed at 08/21/13 0954  Gross per 24 hour  Intake   1000 ml  Output    110 ml  Net    890 ml     BLOOD ADMINISTERED:none   LOCAL MEDICATIONS USED:  Paracervical block Nesacaine 1%  20 cc  SPECIMEN:  Source of Specimen:  Endometrial curettings and resection of IU lesion  DISPOSITION OF SPECIMEN:  PATHOLOGY  COUNTS:  YES  PLAN OF CARE: Transfer to PACU    Princess Bruins MD  08/19/13  10:02 am

## 2013-08-21 NOTE — H&P (Signed)
Autumn Calderon is an 53 y.o. female   RP:  Polypoid IU lesion for [facility] resection, D+C  Pertinent Gynecological History: Menses: flow is moderate Bleeding: intermenstrual bleeding Contraception: Abstinence/condoms Blood transfusions: none Sexually transmitted diseases: no past history Previous GYN Procedures: Myomectomies  Last mammogram: normal  Last pap: normal  OB History:   Menstrual History:  Patient's last menstrual period was 06/30/2013.    Past Medical History  Diagnosis Date  . PONV (postoperative nausea and vomiting)     low blood pressure after anesthesia    Past Surgical History  Procedure Laterality Date  . Uterine fibroid surgery  1996  . 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 Colon surgery  12/2010    partial colectomy  . Fibroid tumor removal      Family History  Problem Relation Age of Onset  . Other Mother     Myelofibrosis (bone marrow), protein s deficency  . Heart disease Father   . Cancer Father     Spot on kidney, part of kidney removed    Social History:  reports that she has never smoked. She does not have any smokeless tobacco history on file. She reports that she drinks alcohol. She reports that she does not use illicit drugs.  Allergies:  Allergies  Allergen Reactions  . Codeine Nausea And Vomiting  . Penicillins Rash    Childhood reaction    Prescriptions prior to admission  Medication Sig Dispense Refill  . loratadine (CLARITIN) 10 MG tablet Take 10 mg by mouth daily.         ROS  Blood pressure 109/52, pulse 77, temperature 98.2 F (36.8 C), temperature source Oral, resp. rate 18, height 5\' 5"  (1.651 m), weight 78.472 kg (173 lb), last menstrual period 06/30/2013, SpO2 98.00%. Physical Exam   Sonohysto:  IU polypoid lesion 2.9 cm   Results for orders placed during the hospital encounter of 08/21/13 (from the past 24 hour(s))  CBC     Status: None   Collection Time    08/21/13  8:18 AM     Result Value Range   WBC 7.0  4.0 - 10.5 K/uL   RBC 4.39  3.87 - 5.11 MIL/uL   Hemoglobin 13.3  12.0 - 15.0 g/dL   HCT 39.2  36.0 - 46.0 %   MCV 89.3  78.0 - 100.0 fL   MCH 30.3  26.0 - 34.0 pg   MCHC 33.9  30.0 - 36.0 g/dL   RDW 13.3  11.5 - 15.5 %   Platelets 261  150 - 400 K/uL    No results found.  Assessment/Plan: Polypoid IU lesion for [facility] resection, D+C.  Surgery and risks reviewed.  Mekia Dipinto,MARIE-LYNE 08/21/2013, 8:52 AM

## 2013-08-21 NOTE — Discharge Summary (Signed)
  Physician Discharge Summary  Patient ID: Autumn Calderon MRN: [id number] DOB/AGE: 11-25-1960 53 y.o.  Admit date: 08/21/2013 Discharge date: 08/21/2013  Admission Diagnoses: Intra-Uterine Lesion, [place]  Discharge Diagnoses: Intra-Uterine Lesion, Oligomenorrhea  [phone]        Active Problems:   * No active hospital problems. *   Discharged Condition: good  Hospital Course: Outpatient  Consults: None  Treatments: surgery: Hysteroscopy with resection and D+C  Disposition: 01-Home or Self Care     Medication List         loratadine 10 MG tablet  Commonly known as:  CLARITIN  Take 10 mg by mouth daily.           Follow-up Information   Follow up with Klay Sobotka,MARIE-LYNE, MD In 3 weeks.   Specialty:  Obstetrics and Gynecology   Contact information:   [place] [address] [phone]       Signed: Princess Bruins, MD 08/21/2013, 10:10 AM

## 2013-08-21 NOTE — Transfer of Care (Signed)
Immediate Anesthesia Transfer of Care Note  Patient: Autumn Calderon  Procedure(s) Performed: Procedure(s): DILATATION & CURETTAGE/HYSTEROSCOPY WITH RESECTOCOPE (N/A)  Patient Location: PACU  Anesthesia Type:General  Level of Consciousness: awake  Airway & Oxygen Therapy: Patient Spontanous Breathing  Post-op Assessment: Report given to PACU RN  Post vital signs: stable  Filed Vitals:   08/21/13 0806  BP: 109/52  Pulse:   Temp:   Resp:     Complications: No apparent anesthesia complications

## 2013-08-21 NOTE — Anesthesia Preprocedure Evaluation (Signed)

## 2013-08-24 ENCOUNTER — Encounter (HOSPITAL_COMMUNITY): Payer: Self-pay | Admitting: Obstetrics & Gynecology

## 2013-08-24 NOTE — Anesthesia Postprocedure Evaluation (Signed)
  Anesthesia Post-op Note  Patient: Autumn Calderon  Procedure(s) Performed: Procedure(s): DILATATION & CURETTAGE/HYSTEROSCOPY WITH RESECTOCOPE (N/A) Patient is awake and responsive. Pain and nausea are reasonably well controlled. Vital signs are stable and clinically acceptable. Oxygen saturation is clinically acceptable. There are no apparent anesthetic complications at this time. Patient is ready for discharge.

## 2013-08-26 NOTE — Addendum Note (Signed)
Addendum created 08/26/13 2263 by Lyndle Herrlich, MD   Modules edited: Anesthesia Attestations

## 2014-08-30 ENCOUNTER — Encounter: Payer: Self-pay | Admitting: Obstetrics & Gynecology

## 2016-05-28 ENCOUNTER — Encounter: Payer: Self-pay | Admitting: Obstetrics & Gynecology

## 2016-12-19 ENCOUNTER — Other Ambulatory Visit: Payer: Self-pay | Admitting: Obstetrics & Gynecology

## 2016-12-19 ENCOUNTER — Ambulatory Visit (INDEPENDENT_AMBULATORY_CARE_PROVIDER_SITE_OTHER): Payer: 59 | Admitting: Obstetrics & Gynecology

## 2016-12-19 ENCOUNTER — Encounter: Payer: Self-pay | Admitting: Obstetrics & Gynecology

## 2016-12-19 VITALS — BP 118/72 | Ht 65.0 in | Wt 153.0 lb

## 2016-12-19 DIAGNOSIS — J301 Allergic rhinitis due to pollen: Secondary | ICD-10-CM

## 2016-12-19 DIAGNOSIS — Z1151 Encounter for screening for human papillomavirus (HPV): Secondary | ICD-10-CM | POA: Diagnosis not present

## 2016-12-19 DIAGNOSIS — Z01419 Encounter for gynecological examination (general) (routine) without abnormal findings: Secondary | ICD-10-CM | POA: Diagnosis not present

## 2016-12-19 DIAGNOSIS — N951 Menopausal and female climacteric states: Secondary | ICD-10-CM | POA: Diagnosis not present

## 2016-12-19 DIAGNOSIS — A6000 Herpesviral infection of urogenital system, unspecified: Secondary | ICD-10-CM | POA: Diagnosis not present

## 2016-12-19 DIAGNOSIS — Z832 Family history of diseases of the blood and blood-forming organs and certain disorders involving the immune mechanism: Secondary | ICD-10-CM

## 2016-12-19 MED ORDER — LORATADINE 10 MG PO TABS: 10.0000 mg | ORAL_TABLET | Freq: Every day | ORAL | 4 refills | Status: DC

## 2016-12-19 MED ORDER — VALACYCLOVIR HCL 1 G PO TABS: 1000.0000 mg | ORAL_TABLET | Freq: Every day | ORAL | 3 refills | Status: DC

## 2016-12-19 NOTE — Patient Instructions (Signed)
1. Hot flashes due to menopause No menses x 1 year with Menopausal Sxs.  Would like to start on HRT low dose Combipatch if Menopause confirmed and Protein S normal.  Benefits and risks of HRT thoroughly reviewed including risk of Blood clots/stroke/Breast Ca.  Benefits with Symptoms, eventual sexual activity and Bone protection discussed. - [place]  2. Encounter for routine gynecological examination with Papanicolaou smear of cervix Normal Gyn exam.  Pap/HPV done.  Will schedule screening Mammo at [facility].  Will establish with Fam MD for Annual exam/Labs.  3. Family history of protein S deficiency Patient never had a blood clot, no DVT, no PE, no stroke.  -Protein S level  4. Herpes simplex infection of genitourinary system   Valacyclovir prescribed.  5. Seasonal allergic rhinitis due to pollen Claritin prescribed.  Autumn Calderon, it was a pleasure to see you today!  I will inform you of your results as soon as available.

## 2016-12-19 NOTE — Progress Notes (Signed)
Autumn Calderon [birth year] [id number]   History:    56 y.o. G0 divorced.  Abstinent currently.  RP:  Established patient presenting for annual gyn exam  D/ced Camila.  No menses x 1 year.  C/O severe hot flashes and night sweats interfering with her enjoyment of life.  Would like to start on HRT.  Mother with Protein S deficiency, rest of Thrombophilic w-up neg.  Sister with h/o Blood clots.  Patient never had a DVT/PE or Stroke.  No pelvic pain.  Normal vaginal secretions.  Breasts wnl.  Mictions/BMs normal.  Patient lost 13 Lbs since last yr with Weight watchers and regular physical activity.  BMI now 25.46  Past medical history,surgical history, family history and social history were all reviewed and documented in the EPIC chart.  Gynecologic History Patient's last menstrual period was 06/30/2013. Contraception: abstinence Last Pap:2/ 2016. Results were: normal Last mammogram: 04/2016. Results were: normal  Obstetric History OB History  Gravida Para Term Preterm AB Living  0 0 0 0 0 0  SAB TAB Ectopic Multiple Live Births  0 0 0 0 0         ROS: A ROS was performed and pertinent positives and negatives are included in the history.  GENERAL: No fevers or chills. HEENT: No change in vision, no earache, sore throat or sinus congestion. NECK: No pain or stiffness. CARDIOVASCULAR: No chest pain or pressure. No palpitations. PULMONARY: No shortness of breath, cough or wheeze. GASTROINTESTINAL: No abdominal pain, nausea, vomiting or diarrhea, melena or bright red blood per rectum. GENITOURINARY: No urinary frequency, urgency, hesitancy or dysuria. MUSCULOSKELETAL: No joint or muscle pain, no back pain, no recent trauma. DERMATOLOGIC: No rash, no itching, no lesions. ENDOCRINE: No polyuria, polydipsia, no heat or cold intolerance. No recent change in weight. HEMATOLOGICAL: No anemia or easy bruising or bleeding. NEUROLOGIC: No headache, seizures, numbness, tingling or weakness.  PSYCHIATRIC: No depression, no loss of interest in normal activity or change in sleep pattern.     Exam:   BP 118/72   Ht 5\' 5"  (1.651 m)   Wt 153 lb (69.4 kg)   LMP 06/30/2013   BMI 25.46 kg/m   Body mass index is 25.46 kg/m.  General appearance : Well developed well nourished female. No acute distress HEENT: Eyes: no retinal hemorrhage or exudates,  Neck supple, trachea midline, no carotid bruits, no thyroidmegaly Lungs: Clear to auscultation, no rhonchi or wheezes, or rib retractions  Heart: Regular rate and rhythm, no murmurs or gallops Breast:Examined in sitting and supine position were symmetrical in appearance, no palpable masses or tenderness,  no skin retraction, no nipple inversion, no nipple discharge, no skin discoloration, no axillary or supraclavicular lymphadenopathy Abdomen: no palpable masses or tenderness, no rebound or guarding Extremities: no edema or skin discoloration or tenderness  Pelvic:  Bartholin, Urethra, Skene Glands: Within normal limits             Vagina: No gross lesions or discharge  Cervix: No gross lesions or discharge.  Pap/HPV HR done  Uterus  AV, normal size, shape and consistency, non-tender and mobile  Adnexa  Without masses or tenderness  Anus and perineum  normal   Assessment/Plan:  56 y.o. female for annual exam  1. Hot flashes due to menopause No menses x 1 year with Menopausal Sxs.  Would like to start on HRT low dose Combipatch if Menopause confirmed and Protein S normal.  Benefits and risks of HRT thoroughly reviewed including risk of  Blood clots/stroke/Breast Ca.  Benefits with Symptoms, eventual sexual activity and Bone protection discussed. - [place]  2. Encounter for routine gynecological examination with Papanicolaou smear of cervix Normal Gyn exam.  Pap/HPV done.  Will schedule screening Mammo at [facility]  Will establish with Fam MD for Annual exam/Labs.  3. Family history of protein S deficiency Patient never had a blood clot,  no DVT, no PE, no stroke.  -Protein S level  4. Herpes simplex infection of genitourinary system   Valacyclovir prescribed.  5. Seasonal allergic rhinitis due to pollen Claritin prescribed.  Counseling on above issues >50% x 10 minutes.   Princess Bruins MD, 9:35 AM 12/19/2016

## 2016-12-19 NOTE — Addendum Note (Signed)
Addended by: Thurnell Garbe A on: 12/19/2016 12:47 PM   Modules accepted: Orders

## 2016-12-19 NOTE — Progress Notes (Signed)
Rx called to pharmacy

## 2016-12-20 LAB — FOLLICLE STIMULATING HORMONE: FSH: 50.3 m[IU]/mL

## 2016-12-21 LAB — PROTEIN S, TOTAL: PROTEIN S ANTIGEN, TOTAL: 125 % (ref 70–140)

## 2016-12-25 ENCOUNTER — Other Ambulatory Visit: Payer: Self-pay | Admitting: Obstetrics & Gynecology

## 2016-12-25 LAB — PAP, TP IMAGING W/ HPV RNA, RFLX HPV TYPE 16,18/45: HPV mRNA, High Risk: NOT DETECTED

## 2016-12-25 MED ORDER — ESTRADIOL-NORETHINDRONE ACET 0.05-0.14 MG/DAY TD PTTW: 1.0000 | MEDICATED_PATCH | TRANSDERMAL | 12 refills | Status: DC

## 2017-11-18 ENCOUNTER — Telehealth: Payer: Self-pay | Admitting: *Deleted

## 2017-11-18 MED ORDER — ESTRADIOL-NORETHINDRONE ACET 0.05-0.14 MG/DAY TD PTTW: 1.0000 | MEDICATED_PATCH | TRANSDERMAL | 2 refills | Status: DC

## 2017-11-18 NOTE — Telephone Encounter (Signed)
Patient has annual exam scheduled on 03/06/18, needs refill combipatch 0.05-0.14. Rx sent with refills until annual.

## 2017-11-20 ENCOUNTER — Encounter: Payer: Self-pay | Admitting: Obstetrics & Gynecology

## 2018-03-06 ENCOUNTER — Ambulatory Visit: Payer: 59 | Admitting: Obstetrics & Gynecology

## 2018-03-06 ENCOUNTER — Encounter: Payer: Self-pay | Admitting: Obstetrics & Gynecology

## 2018-03-06 VITALS — BP 126/84 | Ht 65.0 in | Wt 143.0 lb

## 2018-03-06 DIAGNOSIS — Z01419 Encounter for gynecological examination (general) (routine) without abnormal findings: Secondary | ICD-10-CM | POA: Diagnosis not present

## 2018-03-06 DIAGNOSIS — Z8619 Personal history of other infectious and parasitic diseases: Secondary | ICD-10-CM

## 2018-03-06 DIAGNOSIS — Z79890 Hormone replacement therapy (postmenopausal): Secondary | ICD-10-CM

## 2018-03-06 MED ORDER — ESTRADIOL-NORETHINDRONE ACET 0.05-0.14 MG/DAY TD PTTW: 1.0000 | MEDICATED_PATCH | TRANSDERMAL | 4 refills | Status: DC

## 2018-03-06 MED ORDER — VALACYCLOVIR HCL 1 G PO TABS: 1000.0000 mg | ORAL_TABLET | Freq: Every day | ORAL | 3 refills | Status: DC

## 2018-03-06 NOTE — Progress Notes (Signed)
Autumn Calderon February 06, 1961 [id number]   History:    57 y.o. G0 Divorced  RP:  Established patient presenting for annual gyn exam   HPI: Menopause, well on Combipatch.  Vasomotor Sx very well controled.  No PMB.  No pelvic pain.  Abstinent x last Gyn exam.  Pap 11/2016 negative/HPV HR neg.  Breasts wnl.  BMI 23.80, lost 10 Lbs x last year with Weight Watcher.  Health labs with family physician.  Colonoscopy in 2017.  Past medical history,surgical history, family history and social history were all reviewed and documented in the EPIC chart.  Gynecologic History Patient's last menstrual period was 06/30/2013. Contraception: Postmenopausal/Abstinent Last Pap: 11/2016. Results were: Negative/HPV HR neg Last mammogram: 10/2017. Results were: Negative Bone Density: Never Colonoscopy: 2017  Obstetric History OB History  Gravida Para Term Preterm AB Living  0 0 0 0 0 0  SAB TAB Ectopic Multiple Live Births  0 0 0 0 0     ROS: A ROS was performed and pertinent positives and negatives are included in the history.  GENERAL: No fevers or chills. HEENT: No change in vision, no earache, sore throat or sinus congestion. NECK: No pain or stiffness. CARDIOVASCULAR: No chest pain or pressure. No palpitations. PULMONARY: No shortness of breath, cough or wheeze. GASTROINTESTINAL: No abdominal pain, nausea, vomiting or diarrhea, melena or bright red blood per rectum. GENITOURINARY: No urinary frequency, urgency, hesitancy or dysuria. MUSCULOSKELETAL: No joint or muscle pain, no back pain, no recent trauma. DERMATOLOGIC: No rash, no itching, no lesions. ENDOCRINE: No polyuria, polydipsia, no heat or cold intolerance. No recent change in weight. HEMATOLOGICAL: No anemia or easy bruising or bleeding. NEUROLOGIC: No headache, seizures, numbness, tingling or weakness. PSYCHIATRIC: No depression, no loss of interest in normal activity or change in sleep pattern.     Exam:   BP 126/84   Ht 5\' 5"  (1.651 m)    Wt 143 lb (64.9 kg)   LMP 06/30/2013   BMI 23.80 kg/m   Body mass index is 23.8 kg/m.  General appearance : Well developed well nourished female. No acute distress HEENT: Eyes: no retinal hemorrhage or exudates,  Neck supple, trachea midline, no carotid bruits, no thyroidmegaly Lungs: Clear to auscultation, no rhonchi or wheezes, or rib retractions  Heart: Regular rate and rhythm, no murmurs or gallops Breast:Examined in sitting and supine position were symmetrical in appearance, no palpable masses or tenderness,  no skin retraction, no nipple inversion, no nipple discharge, no skin discoloration, no axillary or supraclavicular lymphadenopathy Abdomen: no palpable masses or tenderness, no rebound or guarding Extremities: no edema or skin discoloration or tenderness  Pelvic: Vulva: Normal             Vagina: No gross lesions or discharge  Cervix: No gross lesions or discharge  Uterus  AV, normal size, shape and consistency, non-tender and mobile  Adnexa  Without masses or tenderness  Anus: Normal   Assessment/Plan:  57 y.o. female for annual exam   1. Well female exam with routine gynecological exam Normal gynecologic exam and menopause.  Pap test negative with negative high-risk HPV in May 2018.  Breast exam normal.  Screening mammogram was negative in April 2019.  Colonoscopy in 2017.  Will do health labs with family physician.  2. Post-menopause on HRT (hormone replacement therapy) Well on Combipatch with good control of the vasomotor menopausal symptoms.  No postmenopausal bleeding.  No contraindication to continue with hormone replacement therapy.  Same regimen represcribed.  Recommend calcium intake of 1.2 g/day as a total of dietary and supplement, vitamin D supplements and regular weightbearing physical activity.  3. History of herpes genitalis Using valacyclovir for recurrences.  Prescription sent to pharmacy.  Other orders - estradiol-norethindrone (COMBIPATCH) 0.05-0.14  MG/DAY; Place 1 patch onto the skin 2 (two) times a week. - valACYclovir (VALTREX) 1000 MG tablet; Take 1 tablet (1,000 mg total) by mouth daily for 5 days.  Princess Bruins MD, 2:09 PM 03/06/2018

## 2018-03-07 ENCOUNTER — Encounter: Payer: Self-pay | Admitting: Obstetrics & Gynecology

## 2018-03-07 NOTE — Patient Instructions (Addendum)
1. Well female exam with routine gynecological exam Normal gynecologic exam and menopause.  Pap test negative with negative high-risk HPV in May 2018.  Breast exam normal.  Screening mammogram was negative in April 2019.  Colonoscopy in 2017.  Will do health labs with family physician.  2. Post-menopause on HRT (hormone replacement therapy) Well on Combipatch with good control of the vasomotor menopausal symptoms.  No postmenopausal bleeding.  No contraindication to continue with hormone replacement therapy.  Same regimen represcribed.  Recommend calcium intake of 1.2 g/day as a total of dietary and supplement, vitamin D supplements and regular weightbearing physical activity.  3. History of herpes genitalis Using valacyclovir for recurrences.  Prescription sent to pharmacy.  Other orders - estradiol-norethindrone (COMBIPATCH) 0.05-0.14 MG/DAY; Place 1 patch onto the skin 2 (two) times a week. - valACYclovir (VALTREX) 1000 MG tablet; Take 1 tablet (1,000 mg total) by mouth daily for 5 days.  Angie, it was a pleasure seeing you today!

## 2018-07-30 HISTORY — PX: BREAST BIOPSY: SHX20

## 2018-11-14 ENCOUNTER — Other Ambulatory Visit: Payer: Self-pay | Admitting: Obstetrics & Gynecology

## 2018-12-31 ENCOUNTER — Encounter: Payer: Self-pay | Admitting: Obstetrics & Gynecology

## 2018-12-31 ENCOUNTER — Other Ambulatory Visit: Payer: Self-pay | Admitting: Radiology

## 2019-03-09 ENCOUNTER — Other Ambulatory Visit: Payer: Self-pay | Admitting: Obstetrics & Gynecology

## 2019-04-02 ENCOUNTER — Other Ambulatory Visit: Payer: Self-pay | Admitting: Obstetrics & Gynecology

## 2019-04-30 ENCOUNTER — Other Ambulatory Visit: Payer: Self-pay | Admitting: Obstetrics & Gynecology

## 2019-05-27 ENCOUNTER — Other Ambulatory Visit: Payer: Self-pay | Admitting: Obstetrics & Gynecology

## 2019-06-01 ENCOUNTER — Ambulatory Visit (INDEPENDENT_AMBULATORY_CARE_PROVIDER_SITE_OTHER): Payer: 59 | Admitting: Obstetrics & Gynecology

## 2019-06-01 ENCOUNTER — Encounter: Payer: Self-pay | Admitting: Obstetrics & Gynecology

## 2019-06-01 ENCOUNTER — Other Ambulatory Visit: Payer: Self-pay

## 2019-06-01 VITALS — BP 118/76 | Ht 65.0 in | Wt 143.0 lb

## 2019-06-01 DIAGNOSIS — Z79890 Hormone replacement therapy (postmenopausal): Secondary | ICD-10-CM

## 2019-06-01 DIAGNOSIS — Z01419 Encounter for gynecological examination (general) (routine) without abnormal findings: Secondary | ICD-10-CM | POA: Diagnosis not present

## 2019-06-01 DIAGNOSIS — D219 Benign neoplasm of connective and other soft tissue, unspecified: Secondary | ICD-10-CM

## 2019-06-01 DIAGNOSIS — Z8619 Personal history of other infectious and parasitic diseases: Secondary | ICD-10-CM | POA: Diagnosis not present

## 2019-06-01 MED ORDER — VALACYCLOVIR HCL 1 G PO TABS: 1000.0000 mg | ORAL_TABLET | Freq: Every day | ORAL | 3 refills | Status: DC

## 2019-06-01 MED ORDER — COMBIPATCH 0.05-0.14 MG/DAY TD PTTW: MEDICATED_PATCH | TRANSDERMAL | 4 refills | Status: DC

## 2019-06-01 NOTE — Progress Notes (Signed)
Autumn Calderon 06/17/1961 [id number]   History:    58 y.o. G0 Divorced  RP:  Established patient presenting for annual gyn exam   HPI: Postmenopausal, well on CombiPatch for the last 2 years.  No postmenopausal bleeding.  No pelvic pain.  Abstinent for more than 2 years.  Urine and bowel movements normal.  Breasts normal.  Very good body mass index at 23.8.  Exercising regularly and on a healthy nutrition.  We will follow-up here for fasting health labs.  Past medical history,surgical history, family history and social history were all reviewed and documented in the EPIC chart.  Gynecologic History Patient's last menstrual period was 06/30/2013. Contraception: post menopausal status Last Pap: 11/2016. Results were: Negative/HPV HR neg Last mammogram: 11/2018. Results were: Negative.  Will obtain report from [facility] Bone Density: Never Colonoscopy: 2017  Obstetric History OB History  Gravida Para Term Preterm AB Living  0 0 0 0 0 0  SAB TAB Ectopic Multiple Live Births  0 0 0 0 0     ROS: A ROS was performed and pertinent positives and negatives are included in the history.  GENERAL: No fevers or chills. HEENT: No change in vision, no earache, sore throat or sinus congestion. NECK: No pain or stiffness. CARDIOVASCULAR: No chest pain or pressure. No palpitations. PULMONARY: No shortness of breath, cough or wheeze. GASTROINTESTINAL: No abdominal pain, nausea, vomiting or diarrhea, melena or bright red blood per rectum. GENITOURINARY: No urinary frequency, urgency, hesitancy or dysuria. MUSCULOSKELETAL: No joint or muscle pain, no back pain, no recent trauma. DERMATOLOGIC: No rash, no itching, no lesions. ENDOCRINE: No polyuria, polydipsia, no heat or cold intolerance. No recent change in weight. HEMATOLOGICAL: No anemia or easy bruising or bleeding. NEUROLOGIC: No headache, seizures, numbness, tingling or weakness. PSYCHIATRIC: No depression, no loss of interest in normal activity or change  in sleep pattern.     Exam:   BP 118/76   Ht 5' 5" (1.651 m)   Wt 143 lb (64.9 kg)   LMP 06/30/2013   BMI 23.80 kg/m   Body mass index is 23.8 kg/m.  General appearance : Well developed well nourished female. No acute distress HEENT: Eyes: no retinal hemorrhage or exudates,  Neck supple, trachea midline, no carotid bruits, no thyroidmegaly Lungs: Clear to auscultation, no rhonchi or wheezes, or rib retractions  Heart: Regular rate and rhythm, no murmurs or gallops Breast:Examined in sitting and supine position were symmetrical in appearance, no palpable masses or tenderness,  no skin retraction, no nipple inversion, no nipple discharge, no skin discoloration, no axillary or supraclavicular lymphadenopathy Abdomen: no palpable masses or tenderness, no rebound or guarding Extremities: no edema or skin discoloration or tenderness  Pelvic: Vulva: Normal             Vagina: No gross lesions or discharge  Cervix: No gross lesions or discharge  Uterus  AV, nodular stable, about 9 cm, non-tender and mobile  Adnexa  Without masses or tenderness  Anus: Normal   Assessment/Plan:  58 y.o. female for annual exam   1. Well female exam with routine gynecological exam Gynecologic exam stable with known small fibroids.  Pap test May 2018 was negative with negative high-risk HPV, will repeat a Pap test at 3 years next year.  Breast exam normal.  Screening mammogram May 2020 was normal per patient, will obtain results from [place].  Colonoscopy in 2017.  We will follow-up here for fasting health labs. - CBC; Future - Comp Met (  CMET); Future - Lipid panel; Future - TSH; Future - VITAMIN D 25 Hydroxy (Vit-D Deficiency, Fractures); Future  2. Post-menopause on HRT (hormone replacement therapy) Post menopause, well on CombiPatch for the last 2 years.  No postmenopausal bleeding.  No contraindication to continue.  Prescription sent to pharmacy.  3. Fibroids Known fibroids, stable in size and  asymptomatic.  4. History of herpes genitalis Valacyclovir prescription sent to pharmacy.  Other orders - estradiol-norethindrone (COMBIPATCH) 0.05-0.14 MG/DAY; APPLY 1 PATCH TWICE A WEEK - valACYclovir (VALTREX) 1000 MG tablet; Take 1 tablet (1,000 mg total) by mouth daily.  Princess Bruins MD, 4:45 PM 06/01/2019

## 2019-06-01 NOTE — Patient Instructions (Signed)
1. Well female exam with routine gynecological exam Gynecologic exam stable with known small fibroids.  Pap test May 2018 was negative with negative high-risk HPV, will repeat a Pap test at 3 years next year.  Breast exam normal.  Screening mammogram May 2020 was normal per patient, will obtain results from [facility].  Colonoscopy in 2017.  We will follow-up here for fasting health labs. - CBC; Future - Comp Met (CMET); Future - Lipid panel; Future - TSH; Future - VITAMIN D 25 Hydroxy (Vit-D Deficiency, Fractures); Future  2. Post-menopause on HRT (hormone replacement therapy) Post menopause, well on CombiPatch for the last 2 years.  No postmenopausal bleeding.  No contraindication to continue.  Prescription sent to pharmacy.  3. Fibroids Known fibroids, stable in size and asymptomatic.  4. History of herpes genitalis Valacyclovir prescription sent to pharmacy.  Other orders - estradiol-norethindrone (COMBIPATCH) 0.05-0.14 MG/DAY; APPLY 1 PATCH TWICE A WEEK - valACYclovir (VALTREX) 1000 MG tablet; Take 1 tablet (1,000 mg total) by mouth daily.  Rayni, it was a pleasure seeing you today!  I will inform you of your results as soon as they are available.

## 2019-06-24 ENCOUNTER — Other Ambulatory Visit: Payer: Self-pay | Admitting: Obstetrics & Gynecology

## 2020-01-05 ENCOUNTER — Encounter: Payer: Self-pay | Admitting: Obstetrics & Gynecology

## 2020-01-13 ENCOUNTER — Encounter: Payer: Self-pay | Admitting: Obstetrics & Gynecology

## 2020-03-27 ENCOUNTER — Other Ambulatory Visit: Payer: Self-pay | Admitting: Obstetrics & Gynecology

## 2020-06-02 ENCOUNTER — Encounter: Payer: Self-pay | Admitting: Obstetrics & Gynecology

## 2020-06-02 ENCOUNTER — Ambulatory Visit (INDEPENDENT_AMBULATORY_CARE_PROVIDER_SITE_OTHER): Payer: 59 | Admitting: Obstetrics & Gynecology

## 2020-06-02 ENCOUNTER — Other Ambulatory Visit: Payer: Self-pay

## 2020-06-02 VITALS — BP 110/70 | Ht 65.0 in | Wt 150.2 lb

## 2020-06-02 DIAGNOSIS — D219 Benign neoplasm of connective and other soft tissue, unspecified: Secondary | ICD-10-CM

## 2020-06-02 DIAGNOSIS — Z13820 Encounter for screening for osteoporosis: Secondary | ICD-10-CM | POA: Diagnosis not present

## 2020-06-02 DIAGNOSIS — Z79890 Hormone replacement therapy (postmenopausal): Secondary | ICD-10-CM | POA: Diagnosis not present

## 2020-06-02 DIAGNOSIS — Z01419 Encounter for gynecological examination (general) (routine) without abnormal findings: Secondary | ICD-10-CM

## 2020-06-02 MED ORDER — COMBIPATCH 0.05-0.14 MG/DAY TD PTTW: MEDICATED_PATCH | TRANSDERMAL | 4 refills | Status: DC

## 2020-06-02 NOTE — Progress Notes (Signed)
Autumn Calderon 03-09-1961 [id number]   History:    59 y.o. G0 Divorced  RP:  Established patient presenting for annual gyn exam   HPI: Postmenopausal, well on CombiPatch for the last 3 years.  No postmenopausal bleeding.  No pelvic pain.  Abstinent for more than 3 years.  Urine and bowel movements normal.  Breasts normal.  Very good body mass index at 24.99.  Exercising regularly and on a healthy nutrition.  Will follow-up here for fasting health labs and Bone Density.  Past medical history,surgical history, family history and social history were all reviewed and documented in the EPIC chart.  Gynecologic History Patient's last menstrual period was 06/30/2013.  Obstetric History OB History  Gravida Para Term Preterm AB Living  0 0 0 0 0 0  SAB TAB Ectopic Multiple Live Births  0 0 0 0 0     ROS: A ROS was performed and pertinent positives and negatives are included in the history.  GENERAL: No fevers or chills. HEENT: No change in vision, no earache, sore throat or sinus congestion. NECK: No pain or stiffness. CARDIOVASCULAR: No chest pain or pressure. No palpitations. PULMONARY: No shortness of breath, cough or wheeze. GASTROINTESTINAL: No abdominal pain, nausea, vomiting or diarrhea, melena or bright red blood per rectum. GENITOURINARY: No urinary frequency, urgency, hesitancy or dysuria. MUSCULOSKELETAL: No joint or muscle pain, no back pain, no recent trauma. DERMATOLOGIC: No rash, no itching, no lesions. ENDOCRINE: No polyuria, polydipsia, no heat or cold intolerance. No recent change in weight. HEMATOLOGICAL: No anemia or easy bruising or bleeding. NEUROLOGIC: No headache, seizures, numbness, tingling or weakness. PSYCHIATRIC: No depression, no loss of interest in normal activity or change in sleep pattern.     Exam:   BP 110/70   Ht _0  (1.651 m)   Wt 150 lb 3.2 oz (68.1 kg)   LMP 06/30/2013   BMI 24.99 kg/m   Body mass index is 24.99 kg/m.  General appearance :  Well developed well nourished female. No acute distress HEENT: Eyes: no retinal hemorrhage or exudates,  Neck supple, trachea midline, no carotid bruits, no thyroidmegaly Lungs: Clear to auscultation, no rhonchi or wheezes, or rib retractions  Heart: Regular rate and rhythm, no murmurs or gallops Breast:Examined in sitting and supine position were symmetrical in appearance, no palpable masses or tenderness,  no skin retraction, no nipple inversion, no nipple discharge, no skin discoloration, no axillary or supraclavicular lymphadenopathy Abdomen: no palpable masses or tenderness, no rebound or guarding Extremities: no edema or skin discoloration or tenderness  Pelvic: Vulva: Normal             Vagina: No gross lesions or discharge  Cervix: No gross lesions or discharge.  Pap reflex done.  Uterus  AV increased in size and nodular with Fibroids, non-tender and mobile  Adnexa  Without masses or tenderness  Anus: Normal   Assessment/Plan:  60 y.o. female for annual exam   1. Encounter for routine gynecological examination with Papanicolaou smear of cervix Normal gynecologic exam.  Pap reflex done.  Breast exam normal.  Screening mammogram June 2021 was negative.  Colonoscopy 2017.  Follow-up here for fasting health labs and bone density.  Good body mass index at 24.99.  Continue with fitness and healthy nutrition. - CBC; Future - Comp Met (CMET); Future - Lipid panel; Future - TSH; Future - VITAMIN D 25 Hydroxy (Vit-D Deficiency, Fractures); Future  2. Post-menopause on HRT (hormone replacement therapy) Well on CombiPatch.  No postmenopausal bleeding.  No contraindication to continue.  CombiPatch represcribed.  3. Screening for osteoporosis Follow-up here for bone density.  Continue on vitamin D supplements, calcium intake of 1500 mg daily and regular weightbearing physical activities. - DG Bone Density; Future  4. Fibroids Stable uterine fibroids.  Will observe.  Other orders -  fexofenadine (ALLEGRA) 60 MG tablet; Take 60 mg by mouth 2 (two) times daily. - estradiol-norethindrone (COMBIPATCH) 0.05-0.14 MG/DAY; Apply patch twice a week.  Princess Bruins MD, 3:53 PM 06/02/2020

## 2020-06-03 ENCOUNTER — Encounter: Payer: Self-pay | Admitting: Obstetrics & Gynecology

## 2020-06-06 ENCOUNTER — Telehealth: Payer: Self-pay | Admitting: *Deleted

## 2020-06-06 LAB — PAP IG W/ RFLX HPV ASCU

## 2020-06-06 MED ORDER — VALACYCLOVIR HCL 1 G PO TABS
1000.0000 mg | ORAL_TABLET | Freq: Every day | ORAL | 3 refills | Status: DC
Start: 2020-06-06 — End: 2021-06-19

## 2020-06-06 NOTE — Telephone Encounter (Signed)
Patient was seen on 06/02/20 for annual exam and refill for Valtrex 1000mg  tablet was not sent to pharmacy. Patient takes daily and Rx was refilled last year. Rx sent.

## 2020-07-26 ENCOUNTER — Ambulatory Visit (INDEPENDENT_AMBULATORY_CARE_PROVIDER_SITE_OTHER): Payer: 59

## 2020-07-26 ENCOUNTER — Other Ambulatory Visit: Payer: Self-pay

## 2020-07-26 ENCOUNTER — Other Ambulatory Visit: Payer: Self-pay | Admitting: Obstetrics & Gynecology

## 2020-07-26 DIAGNOSIS — M8589 Other specified disorders of bone density and structure, multiple sites: Secondary | ICD-10-CM | POA: Diagnosis not present

## 2020-07-26 DIAGNOSIS — Z13820 Encounter for screening for osteoporosis: Secondary | ICD-10-CM

## 2020-07-26 DIAGNOSIS — Z780 Asymptomatic menopausal state: Secondary | ICD-10-CM

## 2021-06-02 ENCOUNTER — Telehealth: Payer: Self-pay | Admitting: *Deleted

## 2021-06-02 MED ORDER — COMBIPATCH 0.05-0.14 MG/DAY TD PTTW
MEDICATED_PATCH | TRANSDERMAL | 0 refills | Status: DC
Start: 2021-06-02 — End: 2021-08-17

## 2021-06-02 NOTE — Telephone Encounter (Signed)
Patient annual exam scheduled on 08/17/21, needs refill on combipatch 0.05mg -0.14mg . Rx sent.

## 2021-06-18 ENCOUNTER — Other Ambulatory Visit: Payer: Self-pay | Admitting: Obstetrics & Gynecology

## 2021-06-19 NOTE — Telephone Encounter (Signed)
Annual exam scheduled on 08/17/21

## 2021-08-17 ENCOUNTER — Ambulatory Visit (INDEPENDENT_AMBULATORY_CARE_PROVIDER_SITE_OTHER): Payer: 59 | Admitting: Obstetrics & Gynecology

## 2021-08-17 ENCOUNTER — Encounter: Payer: Self-pay | Admitting: Obstetrics & Gynecology

## 2021-08-17 ENCOUNTER — Other Ambulatory Visit: Payer: Self-pay

## 2021-08-17 VITALS — BP 114/78 | HR 78 | Resp 16 | Ht 64.75 in | Wt 153.0 lb

## 2021-08-17 DIAGNOSIS — M8589 Other specified disorders of bone density and structure, multiple sites: Secondary | ICD-10-CM | POA: Diagnosis not present

## 2021-08-17 DIAGNOSIS — Z79890 Hormone replacement therapy (postmenopausal): Secondary | ICD-10-CM | POA: Diagnosis not present

## 2021-08-17 DIAGNOSIS — Z01419 Encounter for gynecological examination (general) (routine) without abnormal findings: Secondary | ICD-10-CM | POA: Diagnosis not present

## 2021-08-17 MED ORDER — COMBIPATCH 0.05-0.14 MG/DAY TD PTTW
MEDICATED_PATCH | TRANSDERMAL | 4 refills | Status: DC
Start: 2021-08-17 — End: 2022-08-23

## 2021-08-17 NOTE — Progress Notes (Signed)
Autumn Calderon 07-19-1961 [id number]   History:    61 y.o.  G0 Divorced   RP:  Established patient presenting for annual gyn exam    HPI: Postmenopausal, well on CombiPatch for the last 4 years.  No postmenopausal bleeding.  No pelvic pain.  Abstinent for more than 4 years.   Pap 05/2020 Neg.  No h/o abnormal Paps.  Urine and bowel movements normal.  Breasts normal. Mammo 12/2020 Neg.  Very good body mass index at 25.66.  Exercising regularly and on a healthy nutrition.  Fasting health labs here today. Osteopenia T-Score -1.9 in 06/2020.  Bone Density to schedule 06/2022.  Colono 04/2021.   Past medical history,surgical history, family history and social history were all reviewed and documented in the EPIC chart.  Gynecologic History Patient's last menstrual period was 06/30/2013.  Obstetric History OB History  Gravida Para Term Preterm AB Living  0 0 0 0 0 0  SAB IAB Ectopic Multiple Live Births  0 0 0 0 0     ROS: A ROS was performed and pertinent positives and negatives are included in the history.  GENERAL: No fevers or chills. HEENT: No change in vision, no earache, sore throat or sinus congestion. NECK: No pain or stiffness. CARDIOVASCULAR: No chest pain or pressure. No palpitations. PULMONARY: No shortness of breath, cough or wheeze. GASTROINTESTINAL: No abdominal pain, nausea, vomiting or diarrhea, melena or bright red blood per rectum. GENITOURINARY: No urinary frequency, urgency, hesitancy or dysuria. MUSCULOSKELETAL: No joint or muscle pain, no back pain, no recent trauma. DERMATOLOGIC: No rash, no itching, no lesions. ENDOCRINE: No polyuria, polydipsia, no heat or cold intolerance. No recent change in weight. HEMATOLOGICAL: No anemia or easy bruising or bleeding. NEUROLOGIC: No headache, seizures, numbness, tingling or weakness. PSYCHIATRIC: No depression, no loss of interest in normal activity or change in sleep pattern.     Exam:   BP 114/78    Pulse 78    Resp 16    Ht  5' 4.75" (1.645 m)    Wt 153 lb (69.4 kg)    LMP 06/30/2013    BMI 25.66 kg/m   Body mass index is 25.66 kg/m.  General appearance : Well developed well nourished female. No acute distress HEENT: Eyes: no retinal hemorrhage or exudates,  Neck supple, trachea midline, no carotid bruits, no thyroidmegaly Lungs: Clear to auscultation, no rhonchi or wheezes, or rib retractions  Heart: Regular rate and rhythm, no murmurs or gallops Breast:Examined in sitting and supine position were symmetrical in appearance, no palpable masses or tenderness,  no skin retraction, no nipple inversion, no nipple discharge, no skin discoloration, no axillary or supraclavicular lymphadenopathy Abdomen: no palpable masses or tenderness, no rebound or guarding Extremities: no edema or skin discoloration or tenderness  Pelvic: Vulva: Normal             Vagina: No gross lesions or discharge  Cervix: No gross lesions or discharge  Uterus  AV, normal size, shape and consistency, non-tender and mobile  Adnexa  Without masses or tenderness  Anus: Normal   Assessment/Plan:  61 y.o. female for annual exam   1. Well female exam with routine gynecological exam Postmenopausal, well on CombiPatch for the last 4 years.  No postmenopausal bleeding.  No pelvic pain.  Abstinent for more than 4 years.   Pap 05/2020 Neg.  No h/o abnormal Paps.  Urine and bowel movements normal.  Breasts normal. Mammo 12/2020 Neg.  Very good body mass index  at 25.66.  Exercising regularly and on a healthy nutrition.  Fasting health labs here today. Osteopenia T-Score -1.9 in 06/2020.  Bone Density to schedule 06/2022.  Colono 04/2021. - CBC - Comp Met (CMET) - Lipid Profile - TSH - Vitamin D (25 hydroxy)  2. Post-menopause on HRT (hormone replacement therapy) Postmenopausal, well on CombiPatch for the last 4 years.  No postmenopausal bleeding.  No pelvic pain.  Abstinent for more than 4 years.   3. Osteopenia of multiple sites Osteopenia  T-Score -1.9 in 06/2020.  Bone Density to schedule 06/2022.  Continue HRT.  Vit D supplement, Ca++ total 1.5 g/d, continue wt bearing physical activities. - DG Bone Density; Future  Other orders - estradiol-norethindrone (COMBIPATCH) 0.05-0.14 MG/DAY; Apply patch twice a week.   Princess Bruins MD, 8:34 AM 08/17/2021

## 2021-08-18 ENCOUNTER — Encounter: Payer: Self-pay | Admitting: Obstetrics & Gynecology

## 2021-08-18 LAB — CBC
HCT: 43.8 % (ref 35.0–45.0)
Hemoglobin: 15.4 g/dL (ref 11.7–15.5)
MCH: 34.1 pg — ABNORMAL HIGH (ref 27.0–33.0)
MCHC: 35.2 g/dL (ref 32.0–36.0)
MCV: 97.1 fL (ref 80.0–100.0)
MPV: 10.7 fL (ref 7.5–12.5)
Platelets: 230 10*3/uL (ref 140–400)
RBC: 4.51 10*6/uL (ref 3.80–5.10)
RDW: 12.1 % (ref 11.0–15.0)
WBC: 6.4 10*3/uL (ref 3.8–10.8)

## 2021-08-18 LAB — COMPREHENSIVE METABOLIC PANEL
AG Ratio: 1.7 (calc) (ref 1.0–2.5)
ALT: 10 U/L (ref 6–29)
AST: 15 U/L (ref 10–35)
Albumin: 4.3 g/dL (ref 3.6–5.1)
Alkaline phosphatase (APISO): 59 U/L (ref 37–153)
BUN: 17 mg/dL (ref 7–25)
CO2: 25 mmol/L (ref 20–32)
Calcium: 9.7 mg/dL (ref 8.6–10.4)
Chloride: 107 mmol/L (ref 98–110)
Creat: 0.7 mg/dL (ref 0.50–1.05)
Globulin: 2.5 g/dL (calc) (ref 1.9–3.7)
Glucose, Bld: 89 mg/dL (ref 65–99)
Potassium: 4.3 mmol/L (ref 3.5–5.3)
Sodium: 141 mmol/L (ref 135–146)
Total Bilirubin: 0.6 mg/dL (ref 0.2–1.2)
Total Protein: 6.8 g/dL (ref 6.1–8.1)

## 2021-08-18 LAB — LIPID PANEL
Cholesterol: 226 mg/dL — ABNORMAL HIGH (ref <200)
HDL: 70 mg/dL (ref >=50)
LDL Cholesterol (Calc): 136 mg/dL (calc) — ABNORMAL HIGH
Non-HDL Cholesterol (Calc): 156 mg/dL (calc) — ABNORMAL HIGH (ref <130)
Total CHOL/HDL Ratio: 3.2 (calc) (ref <5.0)
Triglycerides: 94 mg/dL (ref <150)

## 2021-08-18 LAB — VITAMIN D 25 HYDROXY (VIT D DEFICIENCY, FRACTURES): Vit D, 25-Hydroxy: 28 ng/mL — ABNORMAL LOW (ref 30–100)

## 2021-08-18 LAB — TSH: TSH: 1.76 mIU/L (ref 0.40–4.50)

## 2021-09-23 ENCOUNTER — Other Ambulatory Visit: Payer: Self-pay | Admitting: Obstetrics & Gynecology

## 2021-09-25 NOTE — Telephone Encounter (Signed)
Medication refill request: valtrex  Last AEX:  08/17/21  Next AEX: nothing scheduled  Last MMG (if hormonal medication request): 01/05/21  Refill authorized: #[id number] with 2 rf pended for today

## 2022-01-15 ENCOUNTER — Encounter: Payer: Self-pay | Admitting: Obstetrics & Gynecology

## 2022-01-21 ENCOUNTER — Other Ambulatory Visit: Payer: Self-pay | Admitting: Obstetrics & Gynecology

## 2022-01-22 NOTE — Telephone Encounter (Signed)
Last annual exam 07/2021, Rx approved.

## 2022-04-21 ENCOUNTER — Other Ambulatory Visit: Payer: Self-pay | Admitting: Obstetrics & Gynecology

## 2022-04-23 NOTE — Telephone Encounter (Signed)
Last AEX 08/17/2021--nothing scheduled for 2024 as of today.

## 2022-08-22 ENCOUNTER — Other Ambulatory Visit: Payer: Self-pay | Admitting: Obstetrics & Gynecology

## 2022-08-23 NOTE — Telephone Encounter (Signed)
Detailed message left per DPR for patient to return call to advise if she needs medication refilled prior to appointment on Monday or if she has enough to last until appointment.

## 2022-08-23 NOTE — Telephone Encounter (Signed)
Patient returned call. States she asked pharmacy to go ahead and send request prior to appointment because it takes the pharmacy some time to get this prescription filled for her. Patient states she has one more patch for Sunday and then will need to change on Thursday. Asking for prescription to be sent in.    Medication pended for #24, 4RF. Please refill if appropriate.

## 2022-08-26 ENCOUNTER — Other Ambulatory Visit: Payer: Self-pay | Admitting: Obstetrics & Gynecology

## 2022-08-27 ENCOUNTER — Ambulatory Visit (INDEPENDENT_AMBULATORY_CARE_PROVIDER_SITE_OTHER): Payer: 59 | Admitting: Obstetrics & Gynecology

## 2022-08-27 ENCOUNTER — Encounter: Payer: Self-pay | Admitting: Obstetrics & Gynecology

## 2022-08-27 VITALS — BP 118/78 | HR 76 | Ht 64.5 in | Wt 130.0 lb

## 2022-08-27 DIAGNOSIS — Z79890 Hormone replacement therapy (postmenopausal): Secondary | ICD-10-CM | POA: Diagnosis not present

## 2022-08-27 DIAGNOSIS — Z01419 Encounter for gynecological examination (general) (routine) without abnormal findings: Secondary | ICD-10-CM

## 2022-08-27 DIAGNOSIS — M8589 Other specified disorders of bone density and structure, multiple sites: Secondary | ICD-10-CM

## 2022-08-27 MED ORDER — COMBIPATCH 0.05-0.14 MG/DAY TD PTTW: MEDICATED_PATCH | TRANSDERMAL | 4 refills | Status: DC

## 2022-08-27 NOTE — Telephone Encounter (Signed)
Medication refill request: valtrex 1000 mg   Refill authorized: #[id number] with 3 refills pended for today.

## 2022-08-27 NOTE — Progress Notes (Signed)
Autumn Calderon [birth year] [id number]   History:    62 y.o. G0 Divorced   RP:  Established patient presenting for annual gyn exam    HPI: Postmenopausal, well on CombiPatch for the last 5 years.  No postmenopausal bleeding.  No pelvic pain. Abstinent for more than 5 years.   Pap 05/2020 Neg.  No h/o abnormal Paps.  Will repeat Pap next year.  Urine and bowel movements normal. Breasts normal. Mammo 12/2021 Benign.  Very good body mass index at 21.97.  Lost weight on a strict diet, will now maintain weight.  Will get back to walking. Fasting health labs here today. Osteopenia T-Score -1.9 in 06/2020. Bone Density to schedule.  Colono 04/2021.  Flu shot received previously this year.  Past medical history,surgical history, family history and social history were all reviewed and documented in the EPIC chart.  Gynecologic History Patient's last menstrual period was 06/30/2013.  Obstetric History OB History  Gravida Para Term Preterm AB Living  0 0 0 0 0 0  SAB IAB Ectopic Multiple Live Births  0 0 0 0 0     ROS: A ROS was performed and pertinent positives and negatives are included in the history. GENERAL: No fevers or chills. HEENT: No change in vision, no earache, sore throat or sinus congestion. NECK: No pain or stiffness. CARDIOVASCULAR: No chest pain or pressure. No palpitations. PULMONARY: No shortness of breath, cough or wheeze. GASTROINTESTINAL: No abdominal pain, nausea, vomiting or diarrhea, melena or bright red blood per rectum. GENITOURINARY: No urinary frequency, urgency, hesitancy or dysuria. MUSCULOSKELETAL: No joint or muscle pain, no back pain, no recent trauma. DERMATOLOGIC: No rash, no itching, no lesions. ENDOCRINE: No polyuria, polydipsia, no heat or cold intolerance. No recent change in weight. HEMATOLOGICAL: No anemia or easy bruising or bleeding. NEUROLOGIC: No headache, seizures, numbness, tingling or weakness. PSYCHIATRIC: No depression, no loss of interest in normal  activity or change in sleep pattern.     Exam:   BP 118/78 (BP Location: Right Arm, Patient Position: Sitting, Cuff Size: Normal)   Pulse 76   Ht 5' 4.5" (1.638 m)   Wt 130 lb (59 kg)   LMP 06/30/2013   SpO2 95%   BMI 21.97 kg/m   Body mass index is 21.97 kg/m.  General appearance : Well developed well nourished female. No acute distress HEENT: Eyes: no retinal hemorrhage or exudates,  Neck supple, trachea midline, no carotid bruits, no thyroidmegaly Lungs: Clear to auscultation, no rhonchi or wheezes, or rib retractions  Heart: Regular rate and rhythm, no murmurs or gallops Breast:Examined in sitting and supine position were symmetrical in appearance, no palpable masses or tenderness,  no skin retraction, no nipple inversion, no nipple discharge, no skin discoloration, no axillary or supraclavicular lymphadenopathy Abdomen: no palpable masses or tenderness, no rebound or guarding Extremities: no edema or skin discoloration or tenderness  Pelvic: Vulva: Normal             Vagina: No gross lesions or discharge  Cervix: No gross lesions or discharge  Uterus  AV, nodular stable about 9 cm, non-tender and mobile  Adnexa  Without masses or tenderness  Anus: Normal   Assessment/Plan:  62 y.o. female for annual exam   1. Well female exam with routine gynecological exam Postmenopausal, well on CombiPatch for the last 5 years.  No postmenopausal bleeding.  No pelvic pain. Abstinent for more than 5 years.   Pap 05/2020 Neg.  No h/o abnormal Paps.  Will  repeat Pap next year.  Urine and bowel movements normal. Breasts normal. Mammo 12/2021 Benign.  Very good body mass index at 21.97.  Lost weight on a strict diet, will now maintain weight.  Will get back to walking. Fasting health labs here today. Osteopenia T-Score -1.9 in 06/2020. Bone Density to schedule.  Colono 04/2021.  Flu shot received previously this year. - CBC - Comp Met (CMET) - TSH - Lipid Profile - Vitamin D (25  hydroxy)  2. Post-menopause on HRT (hormone replacement therapy) Postmenopausal, well on CombiPatch for the last 5 years.  No postmenopausal bleeding.  No pelvic pain. Abstinent for more than 5 years. No CI to continue.  CombiPatch prescription sent to pharmacy.  3. Osteopenia of multiple sites Osteopenia T-Score -1.9 in 06/2020. Bone Density to schedule.   - DG Bone Density; Future  Other orders - estradiol-norethindrone (COMBIPATCH) 0.05-0.14 MG/DAY; 1 patch twice weekly   Princess Bruins MD, 8:09 AM

## 2022-08-28 LAB — COMPREHENSIVE METABOLIC PANEL
AG Ratio: 1.9 (calc) (ref 1.0–2.5)
ALT: 11 U/L (ref 6–29)
AST: 17 U/L (ref 10–35)
Albumin: 4.3 g/dL (ref 3.6–5.1)
Alkaline phosphatase (APISO): 74 U/L (ref 37–153)
BUN: 15 mg/dL (ref 7–25)
CO2: 29 mmol/L (ref 20–32)
Calcium: 9.8 mg/dL (ref 8.6–10.4)
Chloride: 105 mmol/L (ref 98–110)
Creat: 0.64 mg/dL (ref 0.50–1.05)
Globulin: 2.3 g/dL (calc) (ref 1.9–3.7)
Glucose, Bld: 89 mg/dL (ref 65–99)
Potassium: 4.5 mmol/L (ref 3.5–5.3)
Sodium: 141 mmol/L (ref 135–146)
Total Bilirubin: 0.7 mg/dL (ref 0.2–1.2)
Total Protein: 6.6 g/dL (ref 6.1–8.1)

## 2022-08-28 LAB — CBC
HCT: 42.3 % (ref 35.0–45.0)
Hemoglobin: 14.8 g/dL (ref 11.7–15.5)
MCH: 33.7 pg — ABNORMAL HIGH (ref 27.0–33.0)
MCHC: 35 g/dL (ref 32.0–36.0)
MCV: 96.4 fL (ref 80.0–100.0)
MPV: 10.8 fL (ref 7.5–12.5)
Platelets: 259 10*3/uL (ref 140–400)
RBC: 4.39 10*6/uL (ref 3.80–5.10)
RDW: 12.2 % (ref 11.0–15.0)
WBC: 8.5 10*3/uL (ref 3.8–10.8)

## 2022-08-28 LAB — TSH: TSH: 2.25 mIU/L (ref 0.40–4.50)

## 2022-08-28 LAB — LIPID PANEL
Cholesterol: 249 mg/dL — ABNORMAL HIGH (ref <200)
HDL: 59 mg/dL (ref >=50)
LDL Cholesterol (Calc): 169 mg/dL (calc) — ABNORMAL HIGH
Non-HDL Cholesterol (Calc): 190 mg/dL (calc) — ABNORMAL HIGH (ref <130)
Total CHOL/HDL Ratio: 4.2 (calc) (ref <5.0)
Triglycerides: 94 mg/dL (ref <150)

## 2022-08-28 LAB — VITAMIN D 25 HYDROXY (VIT D DEFICIENCY, FRACTURES): Vit D, 25-Hydroxy: 38 ng/mL (ref 30–100)

## 2022-08-29 ENCOUNTER — Encounter: Payer: Self-pay | Admitting: Obstetrics & Gynecology

## 2022-10-30 ENCOUNTER — Ambulatory Visit (INDEPENDENT_AMBULATORY_CARE_PROVIDER_SITE_OTHER): Payer: 59

## 2022-10-30 ENCOUNTER — Other Ambulatory Visit: Payer: Self-pay | Admitting: Obstetrics & Gynecology

## 2022-10-30 DIAGNOSIS — Z13820 Encounter for screening for osteoporosis: Secondary | ICD-10-CM | POA: Diagnosis not present

## 2022-10-30 DIAGNOSIS — Z79890 Hormone replacement therapy (postmenopausal): Secondary | ICD-10-CM

## 2022-10-30 DIAGNOSIS — Z01419 Encounter for gynecological examination (general) (routine) without abnormal findings: Secondary | ICD-10-CM

## 2022-10-30 DIAGNOSIS — Z780 Asymptomatic menopausal state: Secondary | ICD-10-CM

## 2022-10-30 DIAGNOSIS — M8589 Other specified disorders of bone density and structure, multiple sites: Secondary | ICD-10-CM

## 2022-11-02 ENCOUNTER — Encounter: Payer: Self-pay | Admitting: Obstetrics & Gynecology

## 2022-11-02 NOTE — Telephone Encounter (Signed)
Will route to provider for final review and close encounter.  

## 2023-01-18 ENCOUNTER — Encounter: Payer: Self-pay | Admitting: Obstetrics & Gynecology

## 2023-01-22 ENCOUNTER — Encounter: Payer: Self-pay | Admitting: Obstetrics & Gynecology

## 2023-03-16 ENCOUNTER — Other Ambulatory Visit: Payer: Self-pay | Admitting: Obstetrics & Gynecology

## 2023-03-18 NOTE — Telephone Encounter (Signed)
Medication refill request: Valtrex 1000 Last AEX:  08/27/22  Next AEX: not scheduled at this time  Last MMG (if hormonal medication request): n/a Refill authorized: #[id number] with 3 rf pended for today

## 2023-03-19 ENCOUNTER — Encounter: Payer: Self-pay | Admitting: Obstetrics and Gynecology

## 2023-06-14 ENCOUNTER — Ambulatory Visit (HOSPITAL_COMMUNITY)
Admission: EM | Admit: 2023-06-14 | Discharge: 2023-06-14 | Disposition: A | Discharge status: Home / Self Care | Payer: 59 | Attending: Emergency Medicine | Admitting: Emergency Medicine

## 2023-06-14 ENCOUNTER — Encounter (HOSPITAL_COMMUNITY): Payer: Self-pay | Admitting: Emergency Medicine

## 2023-06-14 ENCOUNTER — Ambulatory Visit (INDEPENDENT_AMBULATORY_CARE_PROVIDER_SITE_OTHER): Payer: 59

## 2023-06-14 DIAGNOSIS — S63642A Sprain of metacarpophalangeal joint of left thumb, initial encounter: Secondary | ICD-10-CM

## 2023-06-14 NOTE — ED Provider Notes (Signed)
HPI  SUBJECTIVE:  Autumn Calderon is a right-handed 62 y.o. female who presents with left thumb pain/soreness at the MCP joint and occasional tingling after having a fall 23 days ago on 10/23 onto an outstretched hand where she hyperextended her thumb.  She denies hearing or feeling a pop, but reports extensive bruising along the thenar eminence going down to her wrist.  This has since resolved.  She denies limitation of motion, thumb or grip weakness.  She denies injury to the rest of the hand or wrist.  She has been icing it for 20 minutes at a time, using Arnica oral and topical and a make-up sponge to brace her thumb with improvement in her symptoms.  She tried a brace with worsening of her symptoms.  States that use also aggravates her pain.  She is on a computer all day at work.  She has no history of left thumb injury.  PCP: None.    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   COLON SURGERY  12/2010   partial colectomy   DILATATION & CURRETTAGE/HYSTEROSCOPY WITH RESECTOCOPE N/A 08/21/2013   Procedure: DILATATION & CURETTAGE/HYSTEROSCOPY WITH RESECTOCOPE;  Surgeon: Genia Del, MD;  Location: [facility];  Service: Gynecology;  Laterality: N/A;   fibroid tumor removal     UTERINE FIBROID SURGERY  1996    Family History  Problem Relation Age of Onset   Other Mother        Myelofibrosis (bone marrow), protein s deficency   Heart disease Father    Cancer Father        lung , Spot on kidney, part of kidney removed   Hypertension Father    Breast cancer Maternal Aunt    Diabetes Paternal Grandmother    Heart disease Paternal Grandmother    ALS Maternal Grandmother    Heart attack Paternal Grandfather     Social History   Tobacco Use    Smoking status: Never   Smokeless tobacco: Never  Vaping Use   Vaping status: Never Used  Substance Use Topics   Alcohol use: Yes    Comment: Socially   Drug use: No    No current facility-administered medications for this encounter.  Current Outpatient Medications:    esomeprazole (NEXIUM) 20 MG packet, Take 20 mg by mouth daily before breakfast., Disp: , Rfl:    estradiol-norethindrone (COMBIPATCH) 0.05-0.14 MG/DAY, 1 patch twice weekly, Disp: 24 patch, Rfl: 4   fexofenadine (ALLEGRA) 60 MG tablet, Take 60 mg by mouth 2 (two) times daily., Disp: , Rfl:    valACYclovir (VALTREX) 1000 MG tablet, TAKE 1 TABLET BY MOUTH EVERY DAY, Disp: 90 tablet, Rfl: 1  Allergies  Allergen Reactions   Codeine Nausea And Vomiting   Penicillins Rash    Childhood reaction     ROS  As noted in HPI.   Physical Exam  BP 136/78 (BP Location: Right Arm)   Pulse 61   Temp 97.9 F (36.6 C) (Oral)   Resp 16   LMP 06/30/2013   SpO2 95%   Constitutional: Well developed, well nourished, no acute distress Eyes:  EOMI, conjunctiva normal bilaterally HENT: Normocephalic, atraumatic,mucus membranes moist Respiratory: Normal inspiratory effort Cardiovascular: Normal rate GI:  nondistended skin: No rash, skin intact Musculoskeletal: Left hand: No tenderness over the trapezium, CMC joint.  Positive tenderness over the distal metacarpal, at the MCP joint and the proximal phalanx.  Tenderness along the thenar eminence.  No tenderness over the distal proximal phalanx, IP joint or distal phalanx.  No bruising. 2 Point discrimination intact.  Cap refill less than 2 seconds.  Patient able to AB duct/adduct and oppose thumb.  Pain and moderate laxity with stressing the ulnar collateral ligament compared to the other side.  The rest wrist of hand nontender. Neurologic: Alert & oriented x 3, no focal neuro deficits Psychiatric: Speech and behavior appropriate   ED Course   Medications - No data to  display  Orders Placed This Encounter  Procedures   DG Hand Complete Left    Standing Status:   Standing    Number of Occurrences:   1    Order Specific Question:   Reason for Exam (SYMPTOM  OR DIAGNOSIS REQUIRED)    Answer:   fall    Order Specific Question:   Release to patient    Answer:   Immediate   AMB referral to orthopedics    Referral Priority:   Urgent    Referral Type:   Consultation    Referral Reason:   Specialty Services Required    Referred to Provider:   Zorita Pang.    Number of Visits Requested:   1   Thumb spica    Standing Status:   Standing    Number of Occurrences:   1    Order Specific Question:   Laterality    Answer:   Left    No results found for this or any previous visit (from the past 24 hour(s)). DG Hand Complete Left  Result Date: 06/14/2023 CLINICAL DATA:  Fall, left thumb pain EXAM: LEFT HAND - COMPLETE 3+ VIEW COMPARISON:  None Available. FINDINGS: There is no evidence of fracture or dislocation. Mild degenerative changes of the left hand and wrist. Soft tissues are unremarkable. IMPRESSION: Negative. Electronically Signed   By: Duanne Guess D.O.   On: 06/14/2023 18:14    ED Clinical Impression  1. Gamekeeper's thumb of left hand, initial encounter      ED Assessment/Plan     Reviewed imaging independently.  No fracture as read by me.  Formal radiology report pending.  Will contact patient at [phone] if radiology overread is different enough from mine and we need to change management.   Will place in a more substantial thumb spica splint as she states that the 1 she currently has makes her thumb hurt.  Will refer to [facility] for follow-up/physical therapy.  Reviewed radiology report see radiology report for full details.  Patient is to take 600 mg of ibuprofen combined with 1000 mg of Tylenol 3 times a day.  She states that she has a large bottle of ibuprofen and Tylenol at home and does not need a prescription.   Continue ice, immobilization.  Follow-up with orthopedics.  Discussed imaging, MDM, treatment plan, and plan for follow-up with patient.  patient agrees with plan.   No orders of the defined types were placed in this encounter.     *This clinic note was created using Dragon dictation software. Therefore, there may be occasional mistakes despite careful proofreading.  ?    Domenick Gong, MD 06/15/23 [phone]

## 2023-06-14 NOTE — Discharge Instructions (Addendum)
I believe you have an injury to your ulnar collateral ligament, known as skiers thumb or gamekeeper's thumb.  We are placing you in a thumb spica.  We will contact you if the radiology overread differs enough from my reading we need to change management.  You can take 600 mg of ibuprofen, and 1000 mg of Tylenol 3 times a day as needed for pain.  Continue ice.  Please follow-up with any one of the hand surgeons at [facility].  I have put in an urgent referral, but if it is not timely enough, you can go to the walk-in clinic and get in their system that way without having an appointment.

## 2023-06-14 NOTE — ED Triage Notes (Signed)
Pt presents with injury to left thumb after she fell oct 23 over a tree root and she caught herself with hand and overextended her left thumb. States she has been wearing brace at home but it is still painful for here.

## 2023-08-28 ENCOUNTER — Other Ambulatory Visit: Payer: Self-pay | Admitting: Obstetrics & Gynecology

## 2023-08-28 DIAGNOSIS — Z79890 Hormone replacement therapy (postmenopausal): Secondary | ICD-10-CM

## 2023-08-28 NOTE — Telephone Encounter (Signed)
Med refill request: Combipatch 0.05-0.14 mg Last AEX: 08/27/22 ML Next AEX: 09/02/23 EB Last MMG (if hormonal med) 01/17/23 BI-RADS 2 benign Refill authorized: Combipatch 0.05-0.14 mg #8. Sent to provider for review.

## 2023-08-30 ENCOUNTER — Encounter: Payer: Self-pay | Admitting: Obstetrics and Gynecology

## 2023-09-02 ENCOUNTER — Encounter: Payer: Self-pay | Admitting: Obstetrics and Gynecology

## 2023-09-02 ENCOUNTER — Ambulatory Visit (INDEPENDENT_AMBULATORY_CARE_PROVIDER_SITE_OTHER): Payer: 59 | Admitting: Obstetrics and Gynecology

## 2023-09-02 ENCOUNTER — Other Ambulatory Visit (HOSPITAL_COMMUNITY)
Admission: RE | Admit: 2023-09-02 | Discharge: 2023-09-02 | Disposition: A | Discharge status: Home / Self Care | Payer: 59 | Source: Ambulatory Visit | Attending: Obstetrics and Gynecology | Admitting: Obstetrics and Gynecology

## 2023-09-02 VITALS — BP 112/70 | HR 70 | Ht 64.5 in | Wt 139.0 lb

## 2023-09-02 DIAGNOSIS — Z01419 Encounter for gynecological examination (general) (routine) without abnormal findings: Secondary | ICD-10-CM | POA: Diagnosis not present

## 2023-09-02 DIAGNOSIS — D229 Melanocytic nevi, unspecified: Secondary | ICD-10-CM

## 2023-09-02 DIAGNOSIS — D219 Benign neoplasm of connective and other soft tissue, unspecified: Secondary | ICD-10-CM | POA: Diagnosis not present

## 2023-09-02 DIAGNOSIS — Z1331 Encounter for screening for depression: Secondary | ICD-10-CM

## 2023-09-02 NOTE — Progress Notes (Signed)
63 y.o. y.o. female here for annual exam. Patient's last menstrual period was 06/30/2013.   Does report some light spotting on tissue on occasions  RP:  Established patient presenting for annual gyn exam    HPI: Postmenopausal, well on CombiPatch for the last 6 years.  No postmenopausal bleeding.  No pelvic pain. Abstinent for more than 5 years.   Pap 05/2020 Neg.  No h/o abnormal Paps.  Will repeat Pap next year.  Urine and bowel movements normal. Breasts normal. Mammo 12/2022 Benign.  Very good body mass index at 21.97.  Lost weight on a strict diet, will now maintain weight.  Will get back to walking. Fasting health labs here today. Osteopenia T-Score -1.9 in 06/2020. -2.1 in 2024.  Colono 04/2021.  Flu shot received previously this year. History of hysteroscopy removal of endometrial polyps and h/o fibroids  Body mass index is 23.49 kg/m.     09/02/2023    8:02 AM  Depression screen PHQ 2/9  Decreased Interest 0  Down, Depressed, Hopeless 0  PHQ - 2 Score 0    Blood pressure 112/70, pulse 70, height 5' 4.5" (1.638 m), weight 139 lb (63 kg), last menstrual period 06/30/2013, SpO2 96%.  No results found for: "DIAGPAP", "HPVHIGH", "ADEQPAP"  GYN HISTORY: No results found for: "DIAGPAP", "HPVHIGH", "ADEQPAP"  OB History  Gravida Para Term Preterm AB Living  0 0 0 0 0 0  SAB IAB Ectopic Multiple Live Births  0 0 0 0 0    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   COLON SURGERY  12/2010   partial colectomy   DILATATION & CURRETTAGE/HYSTEROSCOPY WITH RESECTOCOPE N/A 08/21/2013   Procedure: DILATATION & CURETTAGE/HYSTEROSCOPY WITH RESECTOCOPE;  Surgeon: Genia Del, MD;  Location: [facility];  Service: Gynecology;  Laterality: N/A;    fibroid tumor removal     UTERINE FIBROID SURGERY  1996    Current Outpatient Medications on File Prior to Visit  Medication Sig Dispense Refill   esomeprazole (NEXIUM) 20 MG packet Take 20 mg by mouth daily before breakfast.     estradiol-norethindrone (COMBIPATCH) 0.05-0.14 MG/DAY APPLY 1 PATCH TWICE WEEKLY 8 patch 0   fexofenadine (ALLEGRA) 60 MG tablet Take 60 mg by mouth 2 (two) times daily.     valACYclovir (VALTREX) 1000 MG tablet TAKE 1 TABLET BY MOUTH EVERY DAY 90 tablet 1   No current facility-administered medications on file prior to visit.    Social History   Socioeconomic History   Marital status: Divorced    Spouse name: Not on file   Number of children: Not on file   Years of education: Not on file   Highest education level: Not on file  Occupational History   Not on file  Tobacco Use   Smoking status: Never   Smokeless tobacco: Never  Vaping Use   Vaping status: Never Used  Substance and Sexual Activity   Alcohol use: Yes    Comment: Socially   Drug use: No   Sexual activity: Not Currently    Birth control/protection: Post-menopausal    Comment: 1st intercourse- 64, partners- 5, HSV+  Other Topics Concern   Not on file  Social History Narrative  Not on file   Social Drivers of Health   Financial Resource Strain: Not on file  Food Insecurity: Not on file  Transportation Needs: Not on file  Physical Activity: Not on file  Stress: Not on file  Social Connections: Not on file  Intimate Partner Violence: Not on file    Family History  Problem Relation Age of Onset   Other Mother        Myelofibrosis (bone marrow), protein s deficency   Heart disease Father    Cancer Father        lung , Spot on kidney, part of kidney removed   Hypertension Father    Breast cancer Maternal Aunt    Diabetes Paternal Grandmother    Heart disease Paternal Grandmother    ALS Maternal Grandmother    Heart attack Paternal Grandfather      Allergies   Allergen Reactions   Codeine Nausea And Vomiting   Penicillins Rash    Childhood reaction      Patient's last menstrual period was Patient's last menstrual period was 06/30/2013.Marland Kitchen            Review of Systems Alls systems reviewed and are negative.     Physical Exam Constitutional:      Appearance: Normal appearance.  Genitourinary:     Vulva and urethral meatus normal.     No lesions in the vagina.     Genitourinary Comments: Large left fibroid     Right Labia: No rash, lesions or skin changes.    Left Labia: No lesions, skin changes or rash.    No vaginal discharge or tenderness.     No vaginal prolapse present.    No vaginal atrophy present.     Right Adnexa: not tender, not palpable and no mass present.    Left Adnexa: not tender, not palpable and no mass present.    No cervical motion tenderness or discharge.     Uterus is enlarged and irregular.     Uterus is not tender.  Breasts:    Right: Normal.     Left: Normal.  HENT:     Head: Normocephalic.  Neck:     Thyroid: No thyroid mass, thyromegaly or thyroid tenderness.  Cardiovascular:     Rate and Rhythm: Normal rate and regular rhythm.     Heart sounds: Normal heart sounds, S1 normal and S2 normal.  Pulmonary:     Effort: Pulmonary effort is normal.     Breath sounds: Normal breath sounds and air entry.  Abdominal:     General: There is no distension.     Palpations: Abdomen is soft. There is no mass.     Tenderness: There is no abdominal tenderness. There is no guarding or rebound.  Musculoskeletal:        General: Normal range of motion.     Cervical back: Full passive range of motion without pain, normal range of motion and neck supple. No tenderness.     Right lower leg: No edema.     Left lower leg: No edema.  Neurological:     Mental Status: She is alert.  Skin:    General: Skin is warm.  Psychiatric:        Mood and Affect: Mood normal.        Behavior: Behavior normal.        Thought  Content: Thought content normal.  Vitals and nursing note reviewed. Exam conducted with a chaperone present.  A:         Well Woman GYN exam                             P:        Pap smear collected today Encouraged annual mammogram screening Colon cancer screening up-to-date DXA up-to-date Labs and immunizations ordered today Discussed breast self exams Encouraged healthy lifestyle practices Encouraged Vit D and Calcium  To get ion office [place] to evaluate fibroids and endometrial lining Dermatology mole check- referral placed No follow-ups on file.  Earley Favor

## 2023-09-02 NOTE — Addendum Note (Signed)
Addended by: Earley Favor on: 09/02/2023 12:07 PM   Modules accepted: Orders

## 2023-09-03 ENCOUNTER — Encounter: Payer: Self-pay | Admitting: Obstetrics and Gynecology

## 2023-09-03 ENCOUNTER — Other Ambulatory Visit: Payer: Self-pay

## 2023-09-03 DIAGNOSIS — E789 Disorder of lipoprotein metabolism, unspecified: Secondary | ICD-10-CM

## 2023-09-03 LAB — COMPREHENSIVE METABOLIC PANEL
AG Ratio: 2 (calc) (ref 1.0–2.5)
ALT: 14 U/L (ref 6–29)
AST: 18 U/L (ref 10–35)
Albumin: 4.5 g/dL (ref 3.6–5.1)
Alkaline phosphatase (APISO): 63 U/L (ref 37–153)
BUN: 17 mg/dL (ref 7–25)
CO2: 23 mmol/L (ref 20–32)
Calcium: 9.4 mg/dL (ref 8.6–10.4)
Chloride: 105 mmol/L (ref 98–110)
Creat: 0.69 mg/dL (ref 0.50–1.05)
Globulin: 2.3 g/dL (ref 1.9–3.7)
Glucose, Bld: 78 mg/dL (ref 65–99)
Potassium: 3.8 mmol/L (ref 3.5–5.3)
Sodium: 143 mmol/L (ref 135–146)
Total Bilirubin: 0.5 mg/dL (ref 0.2–1.2)
Total Protein: 6.8 g/dL (ref 6.1–8.1)

## 2023-09-03 LAB — CBC
HCT: 43.2 % (ref 35.0–45.0)
Hemoglobin: 14.5 g/dL (ref 11.7–15.5)
MCH: 34.1 pg — ABNORMAL HIGH (ref 27.0–33.0)
MCHC: 33.6 g/dL (ref 32.0–36.0)
MCV: 101.6 fL — ABNORMAL HIGH (ref 80.0–100.0)
MPV: 10.8 fL (ref 7.5–12.5)
Platelets: 226 10*3/uL (ref 140–400)
RBC: 4.25 10*6/uL (ref 3.80–5.10)
RDW: 12 % (ref 11.0–15.0)
WBC: 7.6 10*3/uL (ref 3.8–10.8)

## 2023-09-03 LAB — LIPID PANEL
Cholesterol: 277 mg/dL — ABNORMAL HIGH (ref <200)
HDL: 75 mg/dL (ref >=50)
LDL Cholesterol (Calc): 173 mg/dL — ABNORMAL HIGH
Non-HDL Cholesterol (Calc): 202 mg/dL — ABNORMAL HIGH (ref <130)
Total CHOL/HDL Ratio: 3.7 (calc) (ref <5.0)
Triglycerides: 145 mg/dL (ref <150)

## 2023-09-03 LAB — SURESWAB® ADVANCED VAGINITIS PLUS,TMA
C. trachomatis RNA, TMA: NOT DETECTED
CANDIDA SPECIES: NOT DETECTED
Candida glabrata: NOT DETECTED
N. gonorrhoeae RNA, TMA: NOT DETECTED
SURESWAB(R) ADV BACTERIAL VAGINOSIS(BV),TMA: NEGATIVE
TRICHOMONAS VAGINALIS (TV),TMA: NOT DETECTED

## 2023-09-03 LAB — HEMOGLOBIN A1C
Hgb A1c MFr Bld: 5.2 %{Hb} (ref <5.7)
Mean Plasma Glucose: 103 mg/dL
eAG (mmol/L): 5.7 mmol/L

## 2023-09-03 LAB — TSH: TSH: 2.1 m[IU]/L (ref 0.40–4.50)

## 2023-09-03 LAB — VITAMIN D 25 HYDROXY (VIT D DEFICIENCY, FRACTURES): Vit D, 25-Hydroxy: 36 ng/mL (ref 30–100)

## 2023-09-04 NOTE — Telephone Encounter (Signed)
Referral f/u via work queue. Encounter closed.

## 2023-09-05 LAB — CYTOLOGY - PAP
Comment: NEGATIVE
Diagnosis: NEGATIVE
High risk HPV: NEGATIVE

## 2023-09-24 ENCOUNTER — Other Ambulatory Visit: Payer: Self-pay

## 2023-09-24 DIAGNOSIS — Z79890 Hormone replacement therapy (postmenopausal): Secondary | ICD-10-CM

## 2023-09-24 MED ORDER — COMBIPATCH 0.05-0.14 MG/DAY TD PTTW: MEDICATED_PATCH | TRANSDERMAL | 0 refills | Status: DC

## 2023-09-24 NOTE — Telephone Encounter (Signed)
 Med refill request: Combipatch 0.05-0.14 Last AEX: 09/02/23 Next AEX: not scheduled  Last MMG (if hormonal med) 01/17/23 birads cat 2 benign  Refill authorized: Last rx 08/29/23 #8 with 0 refills. Patient called and left VM on refill line. Says she spoke with pharmacy about refill and she was advised to call us for refill. Patient stated she has 1 patch left from last rx. Please approve or deny as appropriate.

## 2023-09-25 ENCOUNTER — Other Ambulatory Visit: Payer: Self-pay | Admitting: Obstetrics and Gynecology

## 2023-09-25 NOTE — Telephone Encounter (Signed)
 Med refill request: Valacyclovir 1000mg   Last AEX: 09/01/22 Next AEX: not scheduled  Last MMG (if hormonal med) 01/17/23 birads cat 2 begnin Refill authorized: Last rx 03/19/23 #90 with 1 refill. Please approve or deny as appropriate.

## 2023-09-30 ENCOUNTER — Other Ambulatory Visit: Payer: Self-pay | Admitting: Obstetrics and Gynecology

## 2023-09-30 DIAGNOSIS — Z79890 Hormone replacement therapy (postmenopausal): Secondary | ICD-10-CM

## 2023-09-30 MED ORDER — COMBIPATCH 0.05-0.14 MG/DAY TD PTTW: MEDICATED_PATCH | TRANSDERMAL | 0 refills | Status: DC

## 2023-09-30 NOTE — Telephone Encounter (Signed)
 Medication refill request: estradiol patch  Last AEX:  09/02/23  Next AEX: not scheduled  Last MMG (if hormonal medication request): 01/17/23 WNL  Refill authorized: patient is requesting year supply

## 2023-10-02 ENCOUNTER — Other Ambulatory Visit: Payer: Self-pay

## 2023-10-02 DIAGNOSIS — Z79890 Hormone replacement therapy (postmenopausal): Secondary | ICD-10-CM

## 2023-10-02 MED ORDER — COMBIPATCH 0.05-0.14 MG/DAY TD PTTW
MEDICATED_PATCH | TRANSDERMAL | 11 refills | Status: AC
Start: 2023-10-02 — End: ?

## 2023-10-02 NOTE — Telephone Encounter (Signed)
 Medication refill request: Combi patch  Last AEX:  09/02/23 Next AEX: not scheduled  Last MMG (if hormonal medication request): 01/17/23 Bi-rads 2 benign  Refill authorized: please advise

## 2023-10-14 ENCOUNTER — Encounter (HOSPITAL_BASED_OUTPATIENT_CLINIC_OR_DEPARTMENT_OTHER): Payer: Self-pay | Admitting: Family Medicine

## 2023-10-14 ENCOUNTER — Ambulatory Visit (INDEPENDENT_AMBULATORY_CARE_PROVIDER_SITE_OTHER): Payer: 59 | Admitting: Family Medicine

## 2023-10-14 VITALS — BP 106/66 | HR 72 | Ht 64.0 in | Wt 143.1 lb

## 2023-10-14 DIAGNOSIS — E785 Hyperlipidemia, unspecified: Secondary | ICD-10-CM | POA: Insufficient documentation

## 2023-10-14 DIAGNOSIS — Z0000 Encounter for general adult medical examination without abnormal findings: Secondary | ICD-10-CM

## 2023-10-14 DIAGNOSIS — Z860100 Personal history of colon polyps, unspecified: Secondary | ICD-10-CM | POA: Insufficient documentation

## 2023-10-14 DIAGNOSIS — K625 Hemorrhage of anus and rectum: Secondary | ICD-10-CM | POA: Insufficient documentation

## 2023-10-14 DIAGNOSIS — R195 Other fecal abnormalities: Secondary | ICD-10-CM | POA: Insufficient documentation

## 2023-10-14 DIAGNOSIS — R1084 Generalized abdominal pain: Secondary | ICD-10-CM | POA: Insufficient documentation

## 2023-10-14 NOTE — Assessment & Plan Note (Signed)
 Reviewed labs with patient.  Discussed cholesterol panel in particular and reviewed recommendations related to lifestyle modifications.  ASCVD risk score is about 4%, discussed this with patient as well.  Given this, could primarily focus on lifestyle modifications with plan to monitor lipid panel after implementation of these changes.  Discussed possibility of CAC scoring if numbers continue to be elevated and we are considering medication to assist with management.

## 2023-10-14 NOTE — Progress Notes (Signed)
 New Patient Office Visit  Subjective   Patient ID: Autumn Calderon, female    DOB: 05-28-1961  Age: 63 y.o. MRN: [id number]  CC:  Chief Complaint  Patient presents with   New Patient (Initial Visit)    New Patient just establishing care     HPI Autumn Calderon presents to establish care Last PCP - Dr. Laural Benes, practice closed  HLD: Noted on recent labs with gynecologist.  Has been present on prior labs as well.  Notes family history of high cholesterol.  Has not been on any medications in the past.  She is not aware of any family members who are prescribed medication for cholesterol.  Patient is originally from [place] - father in [place], then has primarily lived in [place]. 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 She enjoys live music, occasionally will go for walks.  Outpatient Encounter Medications as of 10/14/2023  Medication Sig   esomeprazole (NEXIUM) 20 MG packet Take 20 mg by mouth daily before breakfast.   estradiol-norethindrone (COMBIPATCH) 0.05-0.14 MG/DAY APPLY 1 PATCH TWICE WEEKLY   fexofenadine (ALLEGRA) 60 MG tablet Take 60 mg by mouth 2 (two) times daily.   valACYclovir (VALTREX) 1000 MG tablet TAKE 1 TABLET BY MOUTH EVERY DAY   No facility-administered encounter medications on file as of 10/14/2023.    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   COLON SURGERY  12/2010   partial colectomy   DILATATION & CURRETTAGE/HYSTEROSCOPY WITH RESECTOCOPE N/A 08/21/2013   Procedure: DILATATION & CURETTAGE/HYSTEROSCOPY WITH RESECTOCOPE;  Surgeon: Genia Del, MD;  Location: [facility];  Service: Gynecology;  Laterality: N/A;   fibroid tumor removal     UTERINE FIBROID SURGERY   1996    Family History  Problem Relation Age of Onset   Other Mother        Myelofibrosis (bone marrow), protein s deficency   Heart disease Father    Cancer Father        lung , Spot on kidney, part of kidney removed   Hypertension Father    Breast cancer Maternal Aunt    Diabetes Paternal Grandmother    Heart disease Paternal Grandmother    ALS Maternal Grandmother    Heart attack Paternal Grandfather     Social History   Socioeconomic History   Marital status: Divorced    Spouse name: Not on file   Number of children: Not on file   Years of education: Not on file   Highest education level: Some college, no degree  Occupational History   Not on file  Tobacco Use   Smoking status: Never    Passive exposure: Never   Smokeless tobacco: Never  Vaping Use   Vaping status: Never Used  Substance and Sexual Activity   Alcohol use: Yes    Comment: Socially   Drug use: No   Sexual activity: Not Currently    Birth control/protection: Post-menopausal    Comment: 1st intercourse- 17, partners- 5, HSV+  Other Topics Concern   Not on file  Social History Narrative   Not on file   Social Drivers of Health   Financial Resource Strain: Low Risk  (10/10/2023)   Overall  Financial Resource Strain (CARDIA)    Difficulty of Paying Living Expenses: Not hard at all  Food Insecurity: No Food Insecurity (10/10/2023)   Hunger Vital Sign    Worried About Running Out of Food in the Last Year: Never true    Ran Out of Food in the Last Year: Never true  Transportation Needs: Unknown (10/10/2023)   PRAPARE - Administrator, Civil Service (Medical): No    Lack of Transportation (Non-Medical): Not on file  Physical Activity: Unknown (10/10/2023)   Exercise Vital Sign    Days of Exercise per Week: 0 days    Minutes of Exercise per Session: Not on file  Stress: No Stress Concern Present (10/10/2023)   [employer] of Occupational Health - Occupational Stress Questionnaire     Feeling of Stress : Not at all  Social Connections: Socially Isolated (10/10/2023)   Social Connection and Isolation Panel [NHANES]    Frequency of Communication with Friends and Family: Twice a week    Frequency of Social Gatherings with Friends and Family: Twice a week    Attends Religious Services: Never    Database administrator or Organizations: No    Attends Engineer, structural: Not on file    Marital Status: Divorced  Catering manager Violence: Not on file    Objective   BP 106/66 (BP Location: Right Arm, Patient Position: Sitting, Cuff Size: Normal)   Pulse 72   Ht 5\' 4"  (1.626 m)   Wt 143 lb 1.6 oz (64.9 kg)   LMP 06/30/2013   SpO2 97%   BMI 24.56 kg/m   Physical Exam  63 year old female in no acute distress Cardiovascular exam with regular rate and rhythm Lungs clear to auscultation bilaterally  Assessment & Plan:   Hyperlipidemia, unspecified hyperlipidemia type Assessment & Plan: Reviewed labs with patient.  Discussed cholesterol panel in particular and reviewed recommendations related to lifestyle modifications.  ASCVD risk score is about 4%, discussed this with patient as well.  Given this, could primarily focus on lifestyle modifications with plan to monitor lipid panel after implementation of these changes.  Discussed possibility of CAC scoring if numbers continue to be elevated and we are considering medication to assist with management.  Orders: -     Lipid panel; Future  Wellness examination -     Lipid panel; Future  Return in about 4 months (around 02/13/2024) for CPE with fasting labs 1 week prior.    ___________________________________________ Autumn Waldo de [place], MD, ABFM, CAQSM Primary Care and Sports Medicine [facility]

## 2023-10-14 NOTE — Patient Instructions (Signed)
  Medication Instructions:  Your physician recommends that you continue on your current medications as directed. Please refer to the Current Medication list given to you today. --If you need a refill on any your medications before your next appointment, please call your pharmacy first. If no refills are authorized on file call the office.-- Lab Work: Your physician has recommended that you have lab work today: 1 week before next visit If you have labs (blood work) drawn today and your tests are completely normal, you will receive your results via MyChart message OR a phone call from our staff.  Please ensure you check your voicemail in the event that you authorized detailed messages to be left on a delegated number. If you have any lab test that is abnormal or we need to change your treatment, we will call you to review the results.   Follow-Up: Your next appointment:   Your physician recommends that you schedule a follow-up appointment in: 4-6 month physical with Dr. de [place]  You will receive a text message or e-mail with a link to a survey about your care and experience with [place] today! We would greatly appreciate your feedback!   Thanks for letting us be apart of your health journey!!  Primary Care and Sports Medicine   Dr. Ceasar Mons [place]   We encourage you to activate your patient portal called "MyChart".  Sign up information is provided on this After Visit Summary.  MyChart is used to connect with patients for Virtual Visits (Telemedicine).  Patients are able to view lab/test results, encounter notes, upcoming appointments, etc.  Non-urgent messages can be sent to your provider as well. To learn more about what you can do with MyChart, please visit --  [email or web address].

## 2024-01-23 LAB — HM MAMMOGRAPHY

## 2024-01-27 ENCOUNTER — Ambulatory Visit: Payer: Self-pay | Admitting: Obstetrics and Gynecology

## 2024-01-27 ENCOUNTER — Encounter: Payer: Self-pay | Admitting: Obstetrics and Gynecology

## 2024-02-12 ENCOUNTER — Other Ambulatory Visit (HOSPITAL_BASED_OUTPATIENT_CLINIC_OR_DEPARTMENT_OTHER): Payer: Self-pay | Admitting: *Deleted

## 2024-02-12 ENCOUNTER — Other Ambulatory Visit (HOSPITAL_BASED_OUTPATIENT_CLINIC_OR_DEPARTMENT_OTHER)

## 2024-02-12 ENCOUNTER — Ambulatory Visit (HOSPITAL_BASED_OUTPATIENT_CLINIC_OR_DEPARTMENT_OTHER): Payer: Self-pay | Admitting: Family Medicine

## 2024-02-12 DIAGNOSIS — E785 Hyperlipidemia, unspecified: Secondary | ICD-10-CM

## 2024-02-12 DIAGNOSIS — Z0000 Encounter for general adult medical examination without abnormal findings: Secondary | ICD-10-CM

## 2024-02-12 LAB — LIPID PANEL
Chol/HDL Ratio: 2.9 ratio (ref 0.0–4.4)
Cholesterol, Total: 212 mg/dL — ABNORMAL HIGH (ref 100–199)
HDL: 74 mg/dL (ref >39)
LDL Chol Calc (NIH): 125 mg/dL — ABNORMAL HIGH (ref 0–99)
Triglycerides: 72 mg/dL (ref 0–149)
VLDL Cholesterol Cal: 13 mg/dL (ref 5–40)

## 2024-02-19 ENCOUNTER — Encounter (HOSPITAL_BASED_OUTPATIENT_CLINIC_OR_DEPARTMENT_OTHER): Admitting: Family Medicine

## 2024-02-25 ENCOUNTER — Ambulatory Visit (HOSPITAL_BASED_OUTPATIENT_CLINIC_OR_DEPARTMENT_OTHER): Admitting: Family Medicine

## 2024-02-25 ENCOUNTER — Encounter (HOSPITAL_BASED_OUTPATIENT_CLINIC_OR_DEPARTMENT_OTHER): Payer: Self-pay | Admitting: Family Medicine

## 2024-02-25 VITALS — BP 104/66 | HR 64 | Ht 64.0 in | Wt 146.6 lb

## 2024-02-25 DIAGNOSIS — Z0000 Encounter for general adult medical examination without abnormal findings: Secondary | ICD-10-CM | POA: Diagnosis not present

## 2024-02-25 DIAGNOSIS — E785 Hyperlipidemia, unspecified: Secondary | ICD-10-CM

## 2024-02-25 DIAGNOSIS — Z23 Encounter for immunization: Secondary | ICD-10-CM | POA: Diagnosis not present

## 2024-02-25 NOTE — Assessment & Plan Note (Signed)
 Recent labs with slightly elevated total cholesterol and LDL and with normal HDL.  ASCVD risk score is at 3.4%.  We again reviewed ASCVD risk score and meaning of this.  We discussed considerations, a management standpoint and she would like to proceed with lifestyle modifications and continued monitoring which I feel is reasonable.  We will plan to monitor cholesterol panel intermittently will follow-up.

## 2024-02-25 NOTE — Patient Instructions (Signed)
  Medication Instructions:  Your physician recommends that you continue on your current medications as directed. Please refer to the Current Medication list given to you today. --If you need a refill on any your medications before your next appointment, please call your pharmacy first. If no refills are authorized on file call the office.-- Lab Work: Your physician has recommended that you have lab work today: 1 week before next visit If you have labs (blood work) drawn today and your tests are completely normal, you will receive your results via MyChart message OR a phone call from our staff.  Please ensure you check your voicemail in the event that you authorized detailed messages to be left on a delegated number. If you have any lab test that is abnormal or we need to change your treatment, we will call you to review the results.    Follow-Up: Your next appointment:   Your physician recommends that you schedule a follow-up appointment in: 1 year physical with Dr. de [place]  You will receive a text message or e-mail with a link to a survey about your care and experience with [place] today! We would greatly appreciate your feedback!   Thanks for letting us be apart of your health journey!!  Primary Care and Sports Medicine   Dr. Ceasar Mons [place]   We encourage you to activate your patient portal called "MyChart".  Sign up information is provided on this After Visit Summary.  MyChart is used to connect with patients for Virtual Visits (Telemedicine).  Patients are able to view lab/test results, encounter notes, upcoming appointments, etc.  Non-urgent messages can be sent to your provider as well. To learn more about what you can do with MyChart, please visit --  [email or web address].

## 2024-02-25 NOTE — Progress Notes (Signed)
 Subjective:    CC: Annual Physical Exam  HPI: Autumn Calderon is a 63 y.o. presenting for annual physical  I reviewed the past medical history, family history, social history, surgical history, and allergies today and no changes were needed.  Please see the problem list section below in epic for further details.  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   COLON SURGERY  12/2010   partial colectomy   DILATATION & CURRETTAGE/HYSTEROSCOPY WITH RESECTOCOPE N/A 08/21/2013   Procedure: DILATATION & CURETTAGE/HYSTEROSCOPY WITH RESECTOCOPE;  Surgeon: Marie-Lyne Lavoie, MD;  Location: [facility];  Service: Gynecology;  Laterality: N/A;   fibroid tumor removal     UTERINE FIBROID SURGERY  1996   Social History: Social History   Socioeconomic History   Marital status: Divorced    Spouse name: Not on file   Number of children: Not on file   Years of education: Not on file   Highest education level: 12th grade  Occupational History   Not on file  Tobacco Use   Smoking status: Never    Passive exposure: Never   Smokeless tobacco: Never  Vaping Use   Vaping status: Never Used  Substance and Sexual Activity   Alcohol use: Yes    Comment: Socially   Drug use: No   Sexual activity: Not Currently    Birth control/protection: Post-menopausal    Comment: 1st intercourse- 17, partners- 5, HSV+  Other Topics Concern   Not on file  Social History Narrative   Not on file   Social Drivers of Health   Financial Resource Strain: Low Risk  (02/21/2024)   Overall Financial Resource Strain (CARDIA)    Difficulty of Paying Living Expenses: Not hard at all  Food Insecurity: No Food Insecurity  (02/21/2024)   Hunger Vital Sign    Worried About Running Out of Food in the Last Year: Never true    Ran Out of Food in the Last Year: Never true  Transportation Needs: No Transportation Needs (02/21/2024)   PRAPARE - Administrator, Civil Service (Medical): No    Lack of Transportation (Non-Medical): No  Physical Activity: Inactive (02/21/2024)   Exercise Vital Sign    Days of Exercise per Week: 0 days    Minutes of Exercise per Session: Not on file  Stress: No Stress Concern Present (02/21/2024)   [employer] of Occupational Health - Occupational Stress Questionnaire    Feeling of Stress: Not at all  Social Connections: Socially Isolated (02/21/2024)   Social Connection and Isolation Panel    Frequency of Communication with Friends and Family: Three times a week    Frequency of Social Gatherings with Friends and Family: Once a week    Attends Religious Services: Never    Database administrator or Organizations: No    Attends Engineer, structural: Not on file    Marital Status: Divorced   Family History: Family History  Problem Relation Age of Onset   Other Mother        Myelofibrosis (bone marrow), protein s deficency   Heart disease Father    Cancer Father  lung , Spot on kidney, part of kidney removed   Hypertension Father    Breast cancer Maternal Aunt    Diabetes Paternal Grandmother    Heart disease Paternal Grandmother    ALS Maternal Grandmother    Heart attack Paternal Grandfather    Allergies: Allergies  Allergen Reactions   Codeine Nausea And Vomiting   Penicillins Rash    Childhood reaction   Medications: See med rec.  Review of Systems: No headache, visual changes, nausea, vomiting, diarrhea, constipation, dizziness, abdominal pain, skin rash, fevers, chills, night sweats, swollen lymph nodes, weight loss, chest pain, body aches, joint swelling, muscle aches, shortness of breath, mood changes, visual or auditory  hallucinations.  Objective:    BP 104/66 (BP Location: Right Arm, Patient Position: Sitting, Cuff Size: Normal)   Pulse 64   Ht 5' 4 (1.626 m)   Wt 146 lb 9.6 oz (66.5 kg)   LMP 06/30/2013   SpO2 96%   BMI 25.16 kg/m   General: Well Developed, well nourished, and in no acute distress. Neuro: Alert and oriented x3, extra-ocular muscles intact, sensation grossly intact. Cranial nerves II through XII are intact, motor, sensory, and coordinative functions are all intact. HEENT: Normocephalic, atraumatic, pupils equal round reactive to light, neck supple, no masses, no lymphadenopathy, thyroid nonpalpable. Oropharynx, nasopharynx, external ear canals are unremarkable. Skin: Warm and dry, no rashes noted. Cardiac: Regular rate and rhythm, no murmurs rubs or gallops. Respiratory: Clear to auscultation bilaterally. Not using accessory muscles, speaking in full sentences. Abdominal: Soft, nontender, nondistended, positive bowel sounds, no masses, no organomegaly. Musculoskeletal: Shoulder, elbow, wrist, hip, knee, ankle stable, and with full range of motion.  Impression and Recommendations:    Wellness examination Assessment & Plan: Routine HCM labs reviewed. HCM reviewed/discussed. Anticipatory guidance regarding healthy weight, lifestyle and choices given. Recommend healthy diet.  Recommend approximately 150 minutes/week of moderate intensity exercise Recommend regular dental and vision exams Always use seatbelt/lap and shoulder restraints Recommend using smoke alarms and checking batteries at least twice a year Recommend using sunscreen when outside Discussed colon cancer screening recommendations, options.  Patient is UTD Discussed recommendations for shingles vaccine.  Patient reports being UTD - received 2 shots in the past Discussed immunization recommendations. Discussed pneumococcal vaccine recommendations, patient in agreement, administered today   Hyperlipidemia, unspecified  hyperlipidemia type Assessment & Plan: Recent labs with slightly elevated total cholesterol and LDL and with normal HDL.  ASCVD risk score is at 3.4%.  We again reviewed ASCVD risk score and meaning of this.  We discussed considerations, a management standpoint and she would like to proceed with lifestyle modifications and continued monitoring which I feel is reasonable.  We will plan to monitor cholesterol panel intermittently will follow-up.   Need for viral immunization -     Pneumococcal Conjugate PCV21(Capvaxive)  Return in about 1 year (around 02/24/2025) for CPE.   ___________________________________________ Missi Mcmackin de [place], MD, ABFM, CAQSM Primary Care and Sports [facility]

## 2024-02-25 NOTE — Assessment & Plan Note (Signed)
 Routine HCM labs reviewed. HCM reviewed/discussed. Anticipatory guidance regarding healthy weight, lifestyle and choices given. Recommend healthy diet.  Recommend approximately 150 minutes/week of moderate intensity exercise Recommend regular dental and vision exams Always use seatbelt/lap and shoulder restraints Recommend using smoke alarms and checking batteries at least twice a year Recommend using sunscreen when outside Discussed colon cancer screening recommendations, options.  Patient is UTD Discussed recommendations for shingles vaccine.  Patient reports being UTD - received 2 shots in the past Discussed immunization recommendations. Discussed pneumococcal vaccine recommendations, patient in agreement, administered today

## 2024-03-03 ENCOUNTER — Encounter (HOSPITAL_BASED_OUTPATIENT_CLINIC_OR_DEPARTMENT_OTHER): Payer: Self-pay | Admitting: Family Medicine

## 2024-03-05 ENCOUNTER — Other Ambulatory Visit (HOSPITAL_BASED_OUTPATIENT_CLINIC_OR_DEPARTMENT_OTHER): Payer: Self-pay | Admitting: *Deleted

## 2024-03-05 DIAGNOSIS — Z124 Encounter for screening for malignant neoplasm of cervix: Secondary | ICD-10-CM

## 2024-03-09 ENCOUNTER — Encounter (HOSPITAL_BASED_OUTPATIENT_CLINIC_OR_DEPARTMENT_OTHER): Payer: Self-pay

## 2024-03-29 ENCOUNTER — Other Ambulatory Visit: Payer: Self-pay | Admitting: Obstetrics and Gynecology

## 2024-03-31 NOTE — Telephone Encounter (Signed)
 Med refill request: Valtrex   Last AEX: 09/02/23 Next AEX: not scheduled  Last MMG (if hormonal med) 01/23/24 birads cat 1 neg Refill authorized: last rx 09/25/23 #20 with 5 refills. Please approve or deny

## 2024-09-09 ENCOUNTER — Encounter (HOSPITAL_BASED_OUTPATIENT_CLINIC_OR_DEPARTMENT_OTHER): Admitting: Certified Nurse Midwife

## 2024-09-17 ENCOUNTER — Encounter (HOSPITAL_BASED_OUTPATIENT_CLINIC_OR_DEPARTMENT_OTHER): Admitting: Obstetrics and Gynecology

## 2025-02-25 ENCOUNTER — Encounter (HOSPITAL_BASED_OUTPATIENT_CLINIC_OR_DEPARTMENT_OTHER): Admitting: Family Medicine
